# Patient Record
Sex: Female | Born: 1960 | Race: White | Hispanic: No | Marital: Married | Smoking: Former smoker
Health system: Southern US, Community
[De-identification: ages and names within clinical notes are randomized; demographics above are authoritative.]

## PROBLEM LIST (undated history)

## (undated) DIAGNOSIS — K219 Gastro-esophageal reflux disease without esophagitis: Secondary | ICD-10-CM

## (undated) HISTORY — PX: CHOLECYSTECTOMY: SHX55

---

## 1998-10-22 ENCOUNTER — Other Ambulatory Visit: Admission: RE | Admit: 1998-10-22 | Discharge: 1998-10-22 | Payer: Self-pay | Admitting: Obstetrics & Gynecology

## 2014-10-13 ENCOUNTER — Encounter (HOSPITAL_COMMUNITY): Payer: Self-pay | Admitting: Nurse Practitioner

## 2014-10-13 ENCOUNTER — Emergency Department (HOSPITAL_COMMUNITY): Payer: 59

## 2014-10-13 ENCOUNTER — Emergency Department (HOSPITAL_COMMUNITY)
Admission: EM | Admit: 2014-10-13 | Discharge: 2014-10-13 | Disposition: A | Discharge status: Home / Self Care | Payer: 59 | Attending: Emergency Medicine | Admitting: Emergency Medicine

## 2014-10-13 DIAGNOSIS — Z8719 Personal history of other diseases of the digestive system: Secondary | ICD-10-CM | POA: Diagnosis not present

## 2014-10-13 DIAGNOSIS — R1013 Epigastric pain: Secondary | ICD-10-CM | POA: Diagnosis not present

## 2014-10-13 DIAGNOSIS — R079 Chest pain, unspecified: Secondary | ICD-10-CM | POA: Diagnosis present

## 2014-10-13 DIAGNOSIS — R0789 Other chest pain: Secondary | ICD-10-CM

## 2014-10-13 DIAGNOSIS — Z720 Tobacco use: Secondary | ICD-10-CM | POA: Diagnosis not present

## 2014-10-13 HISTORY — DX: Gastro-esophageal reflux disease without esophagitis: K21.9

## 2014-10-13 LAB — BASIC METABOLIC PANEL
Anion gap: 11 (ref 5–15)
BUN: 9 mg/dL (ref 6–23)
CO2: 22 mmol/L (ref 19–32)
Calcium: 9.5 mg/dL (ref 8.4–10.5)
Chloride: 106 mmol/L (ref 96–112)
Creatinine, Ser: 0.83 mg/dL (ref 0.50–1.10)
GFR calc Af Amer: >90 mL/min (ref >90)
GFR calc non Af Amer: 80 mL/min — ABNORMAL LOW (ref >90)
GLUCOSE: 98 mg/dL (ref 70–99)
POTASSIUM: 3.8 mmol/L (ref 3.5–5.1)
Sodium: 139 mmol/L (ref 135–145)

## 2014-10-13 LAB — CBC
HEMATOCRIT: 40.7 % (ref 36.0–46.0)
HEMOGLOBIN: 14 g/dL (ref 12.0–15.0)
MCH: 32.6 pg (ref 26.0–34.0)
MCHC: 34.4 g/dL (ref 30.0–36.0)
MCV: 94.9 fL (ref 78.0–100.0)
Platelets: 191 10*3/uL (ref 150–400)
RBC: 4.29 MIL/uL (ref 3.87–5.11)
RDW: 12.3 % (ref 11.5–15.5)
WBC: 9.1 10*3/uL (ref 4.0–10.5)

## 2014-10-13 LAB — I-STAT TROPONIN, ED: TROPONIN I, POC: 0 ng/mL (ref 0.00–0.08)

## 2014-10-13 NOTE — Discharge Instructions (Signed)

## 2014-10-13 NOTE — ED Notes (Signed)
She c/o midsternal "deep chest pain radiating into my back" onset after waking this morning. Moving and breathing increases the pain, nothing relieves the pain. A&Ox4, resp e/u

## 2014-10-13 NOTE — ED Provider Notes (Signed)
CSN: [id number]     Arrival date & time 10/13/14  1810 History  This chart was scribed for Zadie Rhineonald Aubreana Cornacchia, MD by Annye AsaAnna Dorsett, ED Scribe. This patient was seen in room B18C/B18C and the patient's care was started at 11:08 PM.    Chief Complaint  Patient presents with  . Chest Pain   Patient is a 54 y.o. female presenting with chest pain. The history is provided by the patient. No language interpreter was used.  Chest Pain Pain location:  Substernal area Pain quality: sharp   Pain radiates to:  Mid back (throat) Pain radiates to the back: yes   Pain severity:  Moderate Onset quality:  Sudden Duration:  1 day Timing:  Constant Progression:  Improving Chronicity:  New Relieved by:  Nothing Worsened by:  Deep breathing and movement Ineffective treatments:  None tried Associated symptoms: back pain   Associated symptoms: no abdominal pain, no diaphoresis, no fever, no lower extremity edema, no near-syncope, no numbness, no shortness of breath, no syncope and not vomiting   Risk factors: smoking   Risk factors: no diabetes mellitus, no high cholesterol, no hypertension and no prior DVT/PE    HPI Comments: Penny Lopez is an otherwise healthy 54 y.o. female who presents to the Emergency Department complaining of "sharp" mid sternal chest pain, radiating to her throat and back, beginning this morning. Her pain has improved since onset. Patient reports her pain is exacerbated with movement and deep breathing. Nothing improves her pain; it is unaffected by applied pressure to the area or exertion. No treatments or medications tried PTA. She denies fevers, vomiting, diaphoresis, syncope, SOB, abdominal pain, extremity swelling, numbness or paresthesias in the lower extremities, any strenuous activity or recent long-term periods of immobility (e.g. no plane or car trips over 6-8 hours). She denies a personal history of DM, blood clots in her legs or lungs, stroke. She denies familial history of  MI. She denies prior experience with similar symptoms.   Family reports occasional epigastric abdominal pain recently; patient explains that this only occurs "once every few months," and did not concern her until now. She states that her last episode of abdominal pain was 4 days PTA. She is unsure if this is associated with her diet.   Patient regularly takes Nexium for acid reflux; current symptoms do not feel similar to prior experience with acid reflux. She is an everyday smoker. No PCP at this time.   Past Medical History  Diagnosis Date  . Acid reflux    History reviewed. No pertinent past surgical history. History reviewed. No pertinent family history. History  Substance Use Topics  . Smoking status: Current Every Day Smoker    Types: Cigarettes  . Smokeless tobacco: Not on file  . Alcohol Use: No   OB History    No data available     Review of Systems  Constitutional: Negative for fever and diaphoresis.  Respiratory: Negative for shortness of breath.   Cardiovascular: Positive for chest pain. Negative for leg swelling, syncope and near-syncope.  Gastrointestinal: Negative for vomiting and abdominal pain.  Musculoskeletal: Positive for back pain.  Neurological: Negative for numbness.  All other systems reviewed and are negative.  Allergies  Review of patient's allergies indicates no known allergies.  Home Medications   Prior to Admission medications   Not on File   BP 106/66 mmHg  Pulse 69  Temp(Src) 97.9 F (36.6 C) (Oral)  Resp 19  Ht 5\' 11"  (1.803 m)  Wt  204 lb 8 oz (92.761 kg)  BMI 28.53 kg/m2  SpO2 100% Physical Exam  Nursing note and vitals reviewed.   CONSTITUTIONAL: Well developed/well nourished HEAD: Normocephalic/atraumatic EYES: EOMI/PERRL ENMT: Mucous membranes moist NECK: supple no meningeal signs SPINE/BACK:entire spine nontender CV: S1/S2 noted, no murmurs/rubs/gallops noted; chest pain worsened with movement in the bed LUNGS: Lungs are  clear to auscultation bilaterally, no apparent distress ABDOMEN: soft, nontender, no rebound or guarding, bowel sounds noted throughout abdomen GU:no cva tenderness NEURO: Pt is awake/alert/appropriate, moves all extremitiesx4.  No facial droop.   EXTREMITIES: pulses normal/equal, full ROM, no LE edema or tenderness SKIN: warm, color normal PSYCH: no abnormalities of mood noted, alert and oriented to situation  ED Course  Procedures   DIAGNOSTIC STUDIES: Oxygen Saturation is 100% on RA, normal by my interpretation.    COORDINATION OF CARE: 11:20 PM Discussed treatment plan with pt at bedside and pt agreed to plan.   HEART score is less than 3 Pt prefers to go home and not wait for repeat troponin Pt understand that ruling out MI is difficult with one troponin Pt is well appearing, no distress, low suspicion for PE/Dissection She reports most of her pain is worse with leaning forward in bed and twisting her torso BP 115/72 mmHg  Pulse 63  Temp(Src) 97.9 F (36.6 C) (Oral)  Resp 15  Ht  (1.803 m)  Wt 204 lb 8 oz (92.761 kg)  BMI 28.53 kg/m2  SpO2 100%  Labs Review Labs Reviewed  BASIC METABOLIC PANEL - Abnormal; Notable for the following:    GFR calc non Af Amer 80 (*)    All other components within normal limits  CBC  I-STAT TROPOININ, ED    Imaging Review Dg Chest 2 View  10/13/2014   CLINICAL DATA:  Central chest pain beginning today  EXAM: CHEST  2 VIEW  COMPARISON:  None.  FINDINGS: Heart size is normal. Mediastinal shadows are normal. The lungs are clear. No bronchial thickening. No infiltrate, mass, effusion or collapse. Pulmonary vascularity is normal. No bony abnormality.  IMPRESSION: Normal chest   Electronically Signed   By: Paulina Fusi M.D.   On: 10/13/2014 19:03     EKG Interpretation   Date/Time:  Monday October 13 2014 18:20:56 EDT Ventricular Rate:  71 PR Interval:  162 QRS Duration: 76 QT Interval:  390 QTC Calculation: 423 R Axis:    34 Text Interpretation:  Normal sinus rhythm Normal ECG No old tracing to  compare Confirmed by [facility]  MD, DAVID ([id number]) on 10/13/2014 9:42:47 PM      MDM   Final diagnoses:  Other chest pain    Nursing notes including past medical history and social history reviewed and considered in documentation xrays/imaging reviewed by myself and considered during evaluation Labs/vital reviewed myself and considered during evaluation   I personally performed the services described in this documentation, which was scribed in my presence. The recorded information has been reviewed and is accurate.       Zadie Rhine, MD 10/13/14 [phone]

## 2014-11-08 ENCOUNTER — Emergency Department (HOSPITAL_BASED_OUTPATIENT_CLINIC_OR_DEPARTMENT_OTHER)
Admission: EM | Admit: 2014-11-08 | Discharge: 2014-11-08 | Disposition: A | Discharge status: Home / Self Care | Payer: 59 | Attending: Emergency Medicine | Admitting: Emergency Medicine

## 2014-11-08 ENCOUNTER — Encounter (HOSPITAL_BASED_OUTPATIENT_CLINIC_OR_DEPARTMENT_OTHER): Payer: Self-pay

## 2014-11-08 DIAGNOSIS — Z3202 Encounter for pregnancy test, result negative: Secondary | ICD-10-CM | POA: Diagnosis not present

## 2014-11-08 DIAGNOSIS — R109 Unspecified abdominal pain: Secondary | ICD-10-CM

## 2014-11-08 DIAGNOSIS — Z720 Tobacco use: Secondary | ICD-10-CM | POA: Diagnosis not present

## 2014-11-08 DIAGNOSIS — N390 Urinary tract infection, site not specified: Secondary | ICD-10-CM | POA: Insufficient documentation

## 2014-11-08 DIAGNOSIS — Z8719 Personal history of other diseases of the digestive system: Secondary | ICD-10-CM | POA: Insufficient documentation

## 2014-11-08 LAB — CBC WITH DIFFERENTIAL/PLATELET
BASOS ABS: 0 10*3/uL (ref 0.0–0.1)
Basophils Relative: 0 % (ref 0–1)
EOS ABS: 0.1 10*3/uL (ref 0.0–0.7)
EOS PCT: 1 % (ref 0–5)
HEMATOCRIT: 39.4 % (ref 36.0–46.0)
HEMOGLOBIN: 13.3 g/dL (ref 12.0–15.0)
Lymphocytes Relative: 27 % (ref 12–46)
Lymphs Abs: 2.7 10*3/uL (ref 0.7–4.0)
MCH: 32.4 pg (ref 26.0–34.0)
MCHC: 33.8 g/dL (ref 30.0–36.0)
MCV: 96.1 fL (ref 78.0–100.0)
Monocytes Absolute: 0.6 10*3/uL (ref 0.1–1.0)
Monocytes Relative: 6 % (ref 3–12)
Neutro Abs: 6.5 10*3/uL (ref 1.7–7.7)
Neutrophils Relative %: 66 % (ref 43–77)
PLATELETS: 212 10*3/uL (ref 150–400)
RBC: 4.1 MIL/uL (ref 3.87–5.11)
RDW: 11.8 % (ref 11.5–15.5)
WBC: 9.8 10*3/uL (ref 4.0–10.5)

## 2014-11-08 LAB — URINE MICROSCOPIC-ADD ON

## 2014-11-08 LAB — COMPREHENSIVE METABOLIC PANEL
ALT: 16 U/L (ref 14–54)
ANION GAP: 10 (ref 5–15)
AST: 18 U/L (ref 15–41)
Albumin: 4.1 g/dL (ref 3.5–5.0)
Alkaline Phosphatase: 97 U/L (ref 38–126)
BUN: 15 mg/dL (ref 6–20)
CHLORIDE: 105 mmol/L (ref 101–111)
CO2: 21 mmol/L — ABNORMAL LOW (ref 22–32)
Calcium: 8.8 mg/dL — ABNORMAL LOW (ref 8.9–10.3)
Creatinine, Ser: 0.88 mg/dL (ref 0.44–1.00)
Glucose, Bld: 113 mg/dL — ABNORMAL HIGH (ref 65–99)
POTASSIUM: 4.4 mmol/L (ref 3.5–5.1)
Sodium: 136 mmol/L (ref 135–145)
TOTAL PROTEIN: 6.9 g/dL (ref 6.5–8.1)
Total Bilirubin: 0.8 mg/dL (ref 0.3–1.2)

## 2014-11-08 LAB — URINALYSIS, ROUTINE W REFLEX MICROSCOPIC
BILIRUBIN URINE: NEGATIVE
Glucose, UA: NEGATIVE mg/dL
Hgb urine dipstick: NEGATIVE
KETONES UR: NEGATIVE mg/dL
Nitrite: NEGATIVE
Protein, ur: NEGATIVE mg/dL
Specific Gravity, Urine: 1.02 (ref 1.005–1.030)
UROBILINOGEN UA: 1 mg/dL (ref 0.0–1.0)
pH: 7.5 (ref 5.0–8.0)

## 2014-11-08 LAB — PREGNANCY, URINE: Preg Test, Ur: NEGATIVE

## 2014-11-08 LAB — LIPASE, BLOOD: Lipase: 29 U/L (ref 22–51)

## 2014-11-08 LAB — TROPONIN I: Troponin I: <0.03 ng/mL (ref <0.031)

## 2014-11-08 MED ORDER — CEPHALEXIN 500 MG PO CAPS: 500.0000 mg | ORAL_CAPSULE | Freq: Four times a day (QID) | ORAL | Status: DC

## 2014-11-08 MED ORDER — GI COCKTAIL ~~LOC~~
30.0000 mL | Freq: Once | ORAL | Status: AC
  Administered 2014-11-08: 30 mL via ORAL
  Filled 2014-11-08: qty 30

## 2014-11-08 NOTE — ED Notes (Signed)
Patient here with umbilicus pain that started this am. Nausea with same but no vomiting, no diarrhea. Stopped taking acid medication x 1 week

## 2014-11-08 NOTE — Discharge Instructions (Signed)

## 2014-11-08 NOTE — ED Provider Notes (Signed)
CSN: [id number]     Arrival date & time 11/08/14  1801 History   First MD Initiated Contact with Patient 11/08/14 1844     Chief Complaint  Patient presents with  . Abdominal Pain     (Consider location/radiation/quality/duration/timing/severity/associated sxs/prior Treatment) HPI Penny Lopez is a 54 y.o. female because in for evaluation of abdominal discomfort. She reports a gradual onset umbilical pain similar to previous pains in the past approximately 13:00 following lunch at 12:30. Does not radiate. Rated as 7/10. Patient states she has had these stomach pains on and off for the past 8 years that have been well controlled with self prescribed Nexium. Patient states she has had "acid reflux for years and had been taking Nexium, but saw that it causes kidney problems so I quit taking it last week". Denies any chest pain, shortness of breath, vomiting, urinary symptoms, back pain, dizziness, syncope, dark or tarry stools, bloody stools.  Past Medical History  Diagnosis Date  . Acid reflux    History reviewed. No pertinent past surgical history. No family history on file. History  Substance Use Topics  . Smoking status: Current Every Day Smoker    Types: Cigarettes  . Smokeless tobacco: Not on file  . Alcohol Use: No   OB History    No data available     Review of Systems A 10 point review of systems was completed and was negative except for pertinent positives and negatives as mentioned in the history of present illness     Allergies  Review of patient's allergies indicates no known allergies.  Home Medications   Prior to Admission medications   Medication Sig Start Date End Date Taking? Authorizing Provider  cephALEXin (KEFLEX) 500 MG capsule Take 1 capsule (500 mg total) by mouth 4 (four) times daily. 11/08/14   Jovaun Levene, PA-C   BP 133/72 mmHg  Pulse 58  Temp(Src) 97.9 F (36.6 C) (Oral)  Resp 18  Ht 5\' 11"  (1.803 m)  Wt 200 lb (90.719 kg)  BMI 27.91  kg/m2  SpO2 98% Physical Exam  Constitutional: She is oriented to person, place, and time. She appears well-developed and well-nourished.  HENT:  Head: Normocephalic and atraumatic.  Mouth/Throat: Oropharynx is clear and moist.  Eyes: Conjunctivae are normal. Pupils are equal, round, and reactive to light. Right eye exhibits no discharge. Left eye exhibits no discharge. No scleral icterus.  Neck: Neck supple.  Cardiovascular: Normal rate, regular rhythm and normal heart sounds.   Pulmonary/Chest: Effort normal and breath sounds normal. No respiratory distress. She has no wheezes. She has no rales.  Abdominal: Soft. There is no tenderness.  Abdomen soft, nondistended without any focal tenderness. No rebound or guarding. No palpable or pulsatile masses. Negative Murphy's, McBurney's. Bowel sounds active, no renal bruits. No other lesions or deformities noted  Musculoskeletal: She exhibits no tenderness.  Neurological: She is alert and oriented to person, place, and time.  Cranial Nerves II-XII grossly intact  Skin: Skin is warm and dry. No rash noted.  Psychiatric: She has a normal mood and affect.  Nursing note and vitals reviewed.   ED Course  Procedures (including critical care time) Labs Review Labs Reviewed  URINALYSIS, ROUTINE W REFLEX MICROSCOPIC - Abnormal; Notable for the following:    APPearance CLOUDY (*)    Leukocytes, UA MODERATE (*)    All other components within normal limits  COMPREHENSIVE METABOLIC PANEL - Abnormal; Notable for the following:    CO2 21 (*)  Glucose, Bld 113 (*)    Calcium 8.8 (*)    All other components within normal limits  URINE MICROSCOPIC-ADD ON - Abnormal; Notable for the following:    Squamous Epithelial / LPF MANY (*)    Bacteria, UA MANY (*)    All other components within normal limits  CBC WITH DIFFERENTIAL/PLATELET  LIPASE, BLOOD  TROPONIN I  PREGNANCY, URINE    Imaging Review No results found.   EKG Interpretation None      Meds given in ED:  Medications  gi cocktail (Maalox,Lidocaine,Donnatal) (30 mLs Oral Given 11/08/14 1911)    New Prescriptions   CEPHALEXIN (KEFLEX) 500 MG CAPSULE    Take 1 capsule (500 mg total) by mouth 4 (four) times daily.   Filed Vitals:   11/08/14 1807 11/08/14 2058  BP: 121/80 133/72  Pulse: 74 58  Temp: 98.2 F (36.8 C) 97.9 F (36.6 C)  TempSrc: Oral Oral  Resp: 18 18  Height: 5\' 11"  (1.803 m)   Weight: 200 lb (90.719 kg)   SpO2: 100% 98%    MDM  Vitals stable - WNL -afebrile Pt resting comfortably in ED.  reports she feels much better after administration of GI cocktail. PE--normal abdominal exam. Labwork-labs essentially noncontributory, however there is evidence of UTI on urinalysis.  At this time I feel patient's symptoms are likely due to GI etiology, likely GERD. Encouraged her to restart her PPI, declines any prescription. Will treat UTI empirically. Doubt cardiac or pulmonic source for discomfort today.  I discussed all relevant lab findings and imaging results with pt and they verbalized understanding. Discussed f/u with PCP within 48 hrs and return precautions, pt very amenable to plan.  Final diagnoses:  UTI (lower urinary tract infection)  Abdominal discomfort       Joycie PeekBenjamin Cora Stetson, PA-C 11/08/14 2123  Richardean Canalavid H Yao, MD 11/08/14 [phone]

## 2016-10-28 DIAGNOSIS — R1013 Epigastric pain: Secondary | ICD-10-CM | POA: Diagnosis not present

## 2016-10-28 DIAGNOSIS — R932 Abnormal findings on diagnostic imaging of liver and biliary tract: Secondary | ICD-10-CM | POA: Diagnosis not present

## 2016-10-28 DIAGNOSIS — R1011 Right upper quadrant pain: Secondary | ICD-10-CM | POA: Diagnosis not present

## 2016-10-28 DIAGNOSIS — R110 Nausea: Secondary | ICD-10-CM | POA: Diagnosis not present

## 2016-10-29 DIAGNOSIS — R1011 Right upper quadrant pain: Secondary | ICD-10-CM | POA: Diagnosis not present

## 2016-10-30 DIAGNOSIS — J449 Chronic obstructive pulmonary disease, unspecified: Secondary | ICD-10-CM | POA: Diagnosis not present

## 2016-10-30 DIAGNOSIS — K8064 Calculus of gallbladder and bile duct with chronic cholecystitis without obstruction: Secondary | ICD-10-CM | POA: Diagnosis not present

## 2016-10-30 DIAGNOSIS — R1011 Right upper quadrant pain: Secondary | ICD-10-CM | POA: Diagnosis not present

## 2016-10-30 DIAGNOSIS — K828 Other specified diseases of gallbladder: Secondary | ICD-10-CM | POA: Diagnosis not present

## 2016-10-30 DIAGNOSIS — R109 Unspecified abdominal pain: Secondary | ICD-10-CM | POA: Diagnosis not present

## 2016-10-30 DIAGNOSIS — D72829 Elevated white blood cell count, unspecified: Secondary | ICD-10-CM | POA: Diagnosis not present

## 2016-10-30 DIAGNOSIS — R110 Nausea: Secondary | ICD-10-CM | POA: Diagnosis not present

## 2016-10-31 DIAGNOSIS — K8064 Calculus of gallbladder and bile duct with chronic cholecystitis without obstruction: Secondary | ICD-10-CM | POA: Diagnosis not present

## 2016-10-31 DIAGNOSIS — J449 Chronic obstructive pulmonary disease, unspecified: Secondary | ICD-10-CM | POA: Diagnosis not present

## 2016-10-31 DIAGNOSIS — K8010 Calculus of gallbladder with chronic cholecystitis without obstruction: Secondary | ICD-10-CM | POA: Diagnosis not present

## 2016-10-31 DIAGNOSIS — D72829 Elevated white blood cell count, unspecified: Secondary | ICD-10-CM | POA: Diagnosis not present

## 2016-10-31 DIAGNOSIS — R109 Unspecified abdominal pain: Secondary | ICD-10-CM | POA: Diagnosis not present

## 2017-05-28 DIAGNOSIS — N3091 Cystitis, unspecified with hematuria: Secondary | ICD-10-CM | POA: Diagnosis not present

## 2017-05-28 DIAGNOSIS — N3001 Acute cystitis with hematuria: Secondary | ICD-10-CM | POA: Diagnosis not present

## 2017-12-05 DIAGNOSIS — N3091 Cystitis, unspecified with hematuria: Secondary | ICD-10-CM | POA: Diagnosis not present

## 2017-12-05 DIAGNOSIS — N3000 Acute cystitis without hematuria: Secondary | ICD-10-CM | POA: Diagnosis not present

## 2018-05-30 DIAGNOSIS — M545 Low back pain: Secondary | ICD-10-CM | POA: Diagnosis not present

## 2018-05-30 DIAGNOSIS — K219 Gastro-esophageal reflux disease without esophagitis: Secondary | ICD-10-CM | POA: Insufficient documentation

## 2018-05-30 HISTORY — DX: Gastro-esophageal reflux disease without esophagitis: K21.9

## 2018-08-13 DIAGNOSIS — M940 Chondrocostal junction syndrome [Tietze]: Secondary | ICD-10-CM | POA: Diagnosis not present

## 2018-10-06 DIAGNOSIS — M545 Low back pain: Secondary | ICD-10-CM | POA: Diagnosis not present

## 2021-03-13 ENCOUNTER — Emergency Department (HOSPITAL_BASED_OUTPATIENT_CLINIC_OR_DEPARTMENT_OTHER)
Admission: EM | Admit: 2021-03-13 | Discharge: 2021-03-13 | Disposition: A | Discharge status: Home / Self Care | Payer: 59 | Attending: Emergency Medicine | Admitting: Emergency Medicine

## 2021-03-13 ENCOUNTER — Encounter (HOSPITAL_BASED_OUTPATIENT_CLINIC_OR_DEPARTMENT_OTHER): Payer: Self-pay | Admitting: Emergency Medicine

## 2021-03-13 ENCOUNTER — Emergency Department (HOSPITAL_BASED_OUTPATIENT_CLINIC_OR_DEPARTMENT_OTHER): Payer: 59

## 2021-03-13 ENCOUNTER — Other Ambulatory Visit: Payer: Self-pay

## 2021-03-13 DIAGNOSIS — R079 Chest pain, unspecified: Secondary | ICD-10-CM

## 2021-03-13 DIAGNOSIS — R072 Precordial pain: Secondary | ICD-10-CM | POA: Insufficient documentation

## 2021-03-13 DIAGNOSIS — F17210 Nicotine dependence, cigarettes, uncomplicated: Secondary | ICD-10-CM | POA: Insufficient documentation

## 2021-03-13 LAB — CBC
HCT: 40 % (ref 36.0–46.0)
Hemoglobin: 14.1 g/dL (ref 12.0–15.0)
MCH: 34.6 pg — ABNORMAL HIGH (ref 26.0–34.0)
MCHC: 35.3 g/dL (ref 30.0–36.0)
MCV: 98.3 fL (ref 80.0–100.0)
Platelets: 187 10*3/uL (ref 150–400)
RBC: 4.07 MIL/uL (ref 3.87–5.11)
RDW: 11.9 % (ref 11.5–15.5)
WBC: 9.6 10*3/uL (ref 4.0–10.5)
nRBC: 0 % (ref 0.0–0.2)

## 2021-03-13 LAB — TROPONIN I (HIGH SENSITIVITY)
Troponin I (High Sensitivity): 125 ng/L (ref <18)
Troponin I (High Sensitivity): 129 ng/L (ref <18)

## 2021-03-13 LAB — BASIC METABOLIC PANEL
Anion gap: 8 (ref 5–15)
BUN: 13 mg/dL (ref 6–20)
CO2: 21 mmol/L — ABNORMAL LOW (ref 22–32)
Calcium: 8.7 mg/dL — ABNORMAL LOW (ref 8.9–10.3)
Chloride: 106 mmol/L (ref 98–111)
Creatinine, Ser: 0.81 mg/dL (ref 0.44–1.00)
GFR, Estimated: >60 mL/min (ref >60)
Glucose, Bld: 157 mg/dL — ABNORMAL HIGH (ref 70–99)
Potassium: 3.9 mmol/L (ref 3.5–5.1)
Sodium: 135 mmol/L (ref 135–145)

## 2021-03-13 LAB — PREGNANCY, URINE: Preg Test, Ur: NEGATIVE

## 2021-03-13 MED ORDER — ALUM & MAG HYDROXIDE-SIMETH 200-200-20 MG/5ML PO SUSP
30.0000 mL | Freq: Once | ORAL | Status: AC
  Administered 2021-03-13: 30 mL via ORAL
  Filled 2021-03-13: qty 30

## 2021-03-13 MED ORDER — ASPIRIN 81 MG PO CHEW
324.0000 mg | CHEWABLE_TABLET | Freq: Once | ORAL | Status: AC
  Administered 2021-03-13: 324 mg via ORAL
  Filled 2021-03-13: qty 4

## 2021-03-13 MED ORDER — LIDOCAINE VISCOUS HCL 2 % MT SOLN
15.0000 mL | Freq: Once | OROMUCOSAL | Status: AC
  Administered 2021-03-13: 15 mL via ORAL
  Filled 2021-03-13: qty 15

## 2021-03-13 NOTE — ED Triage Notes (Signed)
Pt reports mid CP intermittently since Wed; denies other sxs and cannot articulate type of pain

## 2021-03-13 NOTE — Discharge Instructions (Addendum)
You are seen in the emergency department for 4 days of intermittent chest pain.  You had blood work EKG and a chest x-ray here that did not show an obvious explanation for your pain.  Your troponin heart enzyme was mildly elevated but not rising.  We are setting you up for an outpatient follow-up with cardiology.  If you experience any worsening or concerning symptoms please return to the emergency department.  It would be reasonable to take 81 mg of aspirin daily.

## 2021-03-13 NOTE — ED Provider Notes (Signed)
[facility] EMERGENCY DEPARTMENT Provider Note   CSN: [id number] Arrival date & time: 03/13/21  1839     History Chief Complaint  Patient presents with   Chest Pain    Penny Lopez is a 60 y.o. female.  She is here with a complaint of on and off chest pain that started 4 days ago.  Never completely goes away but does sometimes worsen.  Improved as she stays still.  Not associate with any shortness of breath, reflux, sweatiness dizziness.  No nausea and vomiting.  Has tried nothing for it.  She is a smoker but no cardiac history  The history is provided by the patient.  Chest Pain Pain location:  Substernal area Pain quality: aching and dull   Pain radiates to:  Does not radiate Pain severity:  Moderate Onset quality:  Gradual Duration:  4 days Timing:  Intermittent Progression:  Unchanged Chronicity:  New Context: movement   Relieved by:  Nothing Worsened by:  Certain positions and movement Ineffective treatments:  Certain positions Associated symptoms: no abdominal pain, no cough, no diaphoresis, no dizziness, no dysphagia, no fever, no heartburn, no nausea, no shortness of breath and no vomiting   Risk factors: smoking       Past Medical History:  Diagnosis Date   Acid reflux     There are no problems to display for this patient.   Past Surgical History:  Procedure Laterality Date   CHOLECYSTECTOMY       OB History   No obstetric history on file.     No family history on file.  Social History   Tobacco Use   Smoking status: Every Day    Types: Cigarettes   Smokeless tobacco: Never  Substance Use Topics   Alcohol use: No   Drug use: No    Home Medications Prior to Admission medications   Medication Sig Start Date End Date Taking? Authorizing Provider  cephALEXin (KEFLEX) 500 MG capsule Take 1 capsule (500 mg total) by mouth 4 (four) times daily. 11/08/14   Joycie Peek, PA-C    Allergies    Patient has no known  allergies.  Review of Systems   Review of Systems  Constitutional:  Negative for diaphoresis and fever.  HENT:  Negative for sore throat and trouble swallowing.   Eyes:  Negative for visual disturbance.  Respiratory:  Negative for cough and shortness of breath.   Cardiovascular:  Positive for chest pain.  Gastrointestinal:  Negative for abdominal pain, heartburn, nausea and vomiting.  Genitourinary:  Negative for dysuria.  Musculoskeletal:  Negative for neck pain.  Skin:  Negative for rash.  Neurological:  Negative for dizziness.   Physical Exam Updated Vital Signs BP 137/84 (BP Location: Right Arm)   Pulse 77   Temp 98.2 F (36.8 C) (Oral)   Resp 18   Ht 5\' 11"  (1.803 m)   Wt 92.1 kg   SpO2 97%   BMI 28.31 kg/m   Physical Exam Vitals and nursing note reviewed.  Constitutional:      General: She is not in acute distress.    Appearance: She is well-developed.  HENT:     Head: Normocephalic and atraumatic.  Eyes:     Conjunctiva/sclera: Conjunctivae normal.  Cardiovascular:     Rate and Rhythm: Normal rate and regular rhythm.     Heart sounds: Normal heart sounds. No murmur heard. Pulmonary:     Effort: Pulmonary effort is normal. No respiratory distress.  Breath sounds: Normal breath sounds.  Abdominal:     Palpations: Abdomen is soft.     Tenderness: There is no abdominal tenderness.  Musculoskeletal:        General: Normal range of motion.     Cervical back: Neck supple.     Right lower leg: No tenderness. No edema.     Left lower leg: No tenderness. No edema.  Skin:    General: Skin is warm and dry.  Neurological:     General: No focal deficit present.     Mental Status: She is alert.    ED Results / Procedures / Treatments   Labs (all labs ordered are listed, but only abnormal results are displayed) Labs Reviewed  BASIC METABOLIC PANEL - Abnormal; Notable for the following components:      Result Value   CO2 21 (*)    Glucose, Bld 157 (*)     Calcium 8.7 (*)    All other components within normal limits  CBC - Abnormal; Notable for the following components:   MCH 34.6 (*)    All other components within normal limits  TROPONIN I (HIGH SENSITIVITY) - Abnormal; Notable for the following components:   Troponin I (High Sensitivity) 125 (*)    All other components within normal limits  TROPONIN I (HIGH SENSITIVITY) - Abnormal; Notable for the following components:   Troponin I (High Sensitivity) 129 (*)    All other components within normal limits  PREGNANCY, URINE    EKG EKG Interpretation  Date/Time:  Saturday March 13 2021 18:50:42 EDT Ventricular Rate:  82 PR Interval:  154 QRS Duration: 76 QT Interval:  354 QTC Calculation: 413 R Axis:   6 Text Interpretation: Normal sinus rhythm Normal ECG No significant change since prior 4/16 Confirmed by Meridee Score [phone]) on 03/13/2021 6:57:43 PM  Radiology DG Chest 2 View  Result Date: 03/13/2021 CLINICAL DATA:  Mid chest pain since Wednesday EXAM: CHEST - 2 VIEW COMPARISON:  10/13/2014 FINDINGS: The heart size and mediastinal contours are within normal limits. Both lungs are clear. The visualized skeletal structures are unremarkable. IMPRESSION: No active cardiopulmonary disease. Electronically Signed   By: Burman Nieves M.D.   On: 03/13/2021 19:29    Procedures Procedures   Medications Ordered in ED Medications  aspirin chewable tablet 324 mg (324 mg Oral Given 03/13/21 2105)  alum & mag hydroxide-simeth (MAALOX/MYLANTA) 200-200-20 MG/5ML suspension 30 mL (30 mLs Oral Given 03/13/21 2235)    And  lidocaine (XYLOCAINE) 2 % viscous mouth solution 15 mL (15 mLs Oral Given 03/13/21 2235)    ED Course  I have reviewed the triage vital signs and the nursing notes.  Pertinent labs & imaging results that were available during my care of the patient were reviewed by me and considered in my medical decision making (see chart for details).  Clinical Course as of 03/14/21  6789  Sat Mar 13, 2021  1935 X-ray interpreted by me as no acute pulmonary disease.  Awaiting radiology reading [MB]  2100 Discussed with Dr. Baird Kay cardiology.  He is recommending a delta troponin and if significantly rising will need cath tomorrow.  No heparin currently.  If troponin is not significantly rising please contact him as she may still need a stress test. [MB]  2226 Discussed with [facility] cardiology Dr. Molli Hazard.  He felt with the troponin not significantly rising she is appropriate for outpatient follow-up.  Putting a referral into cardiology.  Reviewed with patient.  Will try  GI cocktail. [MB]    Clinical Course User Index [MB] Terrilee Files, MD   MDM Rules/Calculators/A&P                          This patient complains of chest pain for 4 days; this involves an extensive number of treatment Options and is a complaint that carries with it a high risk of complications and Morbidity. The differential includes ACS, pneumonia, PE, pneumothorax, vascular, reflux, musculoskeletal  I ordered, reviewed and interpreted labs, which included CBC with normal white count chemistries fairly normal other than low bicarb elevated glucose, troponins mildly elevated but flat, pregnancy test negative I ordered medication aspirin, GI cocktail I ordered imaging studies which included chest x-ray and I independently    visualized and interpreted imaging which showed no acute findings Additional history obtained from patient's husband Previous records obtained and reviewed in epic no recent cardiac work-ups I consulted Dr. Molli Hazard [facility] cardiology and discussed lab and imaging findings  Critical Interventions: None  After the interventions stated above, I reevaluated the patient and found patient currently be pain-free.  She says it comes and goes.  Reviewed work-up with her and cardiology recommendations.  Have placed a referral in for urgent cardiology follow-up.  Recommended that she  return to the emergency department if any return of symptoms.   Final Clinical Impression(s) / ED Diagnoses Final diagnoses:  Nonspecific chest pain    Rx / DC Orders ED Discharge Orders     None        Terrilee Files, MD 03/14/21 [phone]

## 2021-03-15 DIAGNOSIS — K219 Gastro-esophageal reflux disease without esophagitis: Secondary | ICD-10-CM | POA: Insufficient documentation

## 2021-03-16 ENCOUNTER — Other Ambulatory Visit: Payer: Self-pay

## 2021-03-16 ENCOUNTER — Ambulatory Visit: Payer: 59 | Admitting: Cardiology

## 2021-03-16 ENCOUNTER — Encounter: Payer: Self-pay | Admitting: Cardiology

## 2021-03-16 DIAGNOSIS — I209 Angina pectoris, unspecified: Secondary | ICD-10-CM

## 2021-03-16 DIAGNOSIS — F17210 Nicotine dependence, cigarettes, uncomplicated: Secondary | ICD-10-CM | POA: Insufficient documentation

## 2021-03-16 DIAGNOSIS — I200 Unstable angina: Secondary | ICD-10-CM

## 2021-03-16 HISTORY — DX: Unstable angina: I20.0

## 2021-03-16 HISTORY — DX: Nicotine dependence, cigarettes, uncomplicated: F17.210

## 2021-03-16 MED ORDER — ASPIRIN EC 81 MG PO TBEC: 81.0000 mg | DELAYED_RELEASE_TABLET | Freq: Every day | ORAL | 3 refills | Status: DC

## 2021-03-16 MED ORDER — NITROGLYCERIN 0.4 MG SL SUBL: 0.4000 mg | SUBLINGUAL_TABLET | SUBLINGUAL | 6 refills | Status: DC | PRN

## 2021-03-16 NOTE — Progress Notes (Signed)
Cardiology Office Note:    Date:  03/16/2021   ID:  Penny Lopez, DOB 01/05/1961, MRN [id number]  PCP:  Oneita Hurt, No  Cardiologist:  Garwin Brothers, MD   Referring MD: Terrilee Files, MD    ASSESSMENT:    1. Angina pectoris (HCC)   2. Cigarette smoker    PLAN:    In order of problems listed above:  Primary prevention stressed with the patient.  Importance of compliance with diet medication stressed and she vocalized understanding. Angina pectoris: Her symptoms are very concerning.  She was advised to take a coated aspirin on a daily basis and she agrees.  Sublingual nitroglycerin prescription was sent, its protocol and 911 protocol explained and the patient vocalized understanding questions were answered to the patient's satisfaction.In view of the patient's symptoms, I discussed with the patient options for evaluation. Invasive and noninvasive options were given to the patient. I discussed stress testing and coronary angiography and left heart catheterization at length. Benefits, pros and cons of each approach were discussed at length. Patient had multiple questions which were answered to the patient's satisfaction. Patient opted for invasive evaluation and we will set up for coronary angiography and left heart catheterization. Further recommendations will be made based on the findings with coronary angiography. In the interim if the patient has any significant symptoms in hospital to the nearest emergency room.  I also discussed CT coronary angiography with [facility] but she is not keen. Cigarette smoker: I spent 5 minutes with the patient discussing solely about smoking. Smoking cessation was counseled. I suggested to the patient also different medications and pharmacological interventions. Patient is keen to try stopping on its own at this time. He will get back to me if he needs any further assistance in this matter. She will be seen in follow-up appointment after the coronary angiography.   She had multiple questions which were answered to her satisfaction.   Medication Adjustments/Labs and Tests Ordered: Current medicines are reviewed at length with the patient today.  Concerns regarding medicines are outlined above.  No orders of the defined types were placed in this encounter.  No orders of the defined types were placed in this encounter.    History of Present Illness:    Penny Lopez is a 60 y.o. female who is being seen today for the evaluation of prevention of x-rays patient is a pleasant 60 year old female.  She has past medical history of heavy smoking since young age.  She denies any history of hypertension dyslipidemia or diabetes mellitus.  She mentions to me that she is experiencing substernal chest tightness radiating to the left arm.  This is of significant concern to her before she went to the emergency room.  I reviewed emergency room records.  Troponin was elevated.  Subsequently she was sent home and she went back to work.  She tells me that she has had 2 episodes which were very significant and she almost had to stop what she was doing.  At the time of my evaluation, the patient is alert awake oriented and in no distress.  She does not have any nitroglycerin tablets with her.  At this time she is comfortable.  Past Medical History:  Diagnosis Date   Acid reflux    Gastroesophageal reflux disease 05/30/2018    Past Surgical History:  Procedure Laterality Date   CHOLECYSTECTOMY      Current Medications: Current Meds  Medication Sig   Esomeprazole Magnesium (NEXIUM PO) Take 1  capsule by mouth daily.     Allergies:   Patient has no known allergies.   Social History   Socioeconomic History   Marital status: Married    Spouse name: Not on file   Number of children: Not on file   Years of education: Not on file   Highest education level: Not on file  Occupational History   Not on file  Tobacco Use   Smoking status: Every Day    Types:  Cigarettes   Smokeless tobacco: Never  Substance and Sexual Activity   Alcohol use: No   Drug use: No   Sexual activity: Not on file  Other Topics Concern   Not on file  Social History Narrative   Not on file   Social Determinants of Health   Financial Resource Strain: Not on file  Food Insecurity: Not on file  Transportation Needs: Not on file  Physical Activity: Not on file  Stress: Not on file  Social Connections: Not on file     Family History: The patient's family history includes Atrial fibrillation in her mother; Heart failure in her father. There is no history of Hypertension, Diabetes, Cancer, or Heart disease.  ROS:   Please see the history of present illness.    All other systems reviewed and are negative.  EKGs/Labs/Other Studies Reviewed:    The following studies were reviewed today: EKG reveals sinus rhythm and nonspecific ST-T changes.  Records from emergency room were reviewed.   Recent Labs: 03/13/2021: BUN 13; Creatinine, Ser 0.81; Hemoglobin 14.1; Platelets 187; Potassium 3.9; Sodium 135  Recent Lipid Panel No results found for: CHOL, TRIG, HDL, CHOLHDL, VLDL, LDLCALC, LDLDIRECT  Physical Exam:    VS:  BP 118/76   Pulse 78   Ht 5\' 11"  (1.803 m)   Wt 205 lb 6.4 oz (93.2 kg)   SpO2 97%   BMI 28.65 kg/m     Wt Readings from Last 3 Encounters:  03/16/21 205 lb 6.4 oz (93.2 kg)  03/13/21 203 lb (92.1 kg)  11/08/14 200 lb (90.7 kg)     GEN: Patient is in no acute distress HEENT: Normal NECK: No JVD; No carotid bruits LYMPHATICS: No lymphadenopathy CARDIAC: S1 S2 regular, 2/6 systolic murmur at the apex. RESPIRATORY:  Clear to auscultation without rales, wheezing or rhonchi  ABDOMEN: Soft, non-tender, non-distended MUSCULOSKELETAL:  No edema; No deformity  SKIN: Warm and dry NEUROLOGIC:  Alert and oriented x 3 PSYCHIATRIC:  Normal affect    Signed, 11/10/14, MD  03/16/2021 4:09 PM    [facility]

## 2021-03-16 NOTE — H&P (View-Only) (Signed)
Cardiology Office Note:    Date:  03/16/2021   ID:  Penny Lopez, DOB Apr 10, 1961, MRN [id number]  PCP:  Oneita Hurt, No  Cardiologist:  Garwin Brothers, MD   Referring MD: Terrilee Files, MD    ASSESSMENT:    1. Angina pectoris (HCC)   2. Cigarette smoker    PLAN:    In order of problems listed above:  Primary prevention stressed with the patient.  Importance of compliance with diet medication stressed and she vocalized understanding. Angina pectoris: Her symptoms are very concerning.  She was advised to take a coated aspirin on a daily basis and she agrees.  Sublingual nitroglycerin prescription was sent, its protocol and 911 protocol explained and the patient vocalized understanding questions were answered to the patient's satisfaction.In view of the patient's symptoms, I discussed with the patient options for evaluation. Invasive and noninvasive options were given to the patient. I discussed stress testing and coronary angiography and left heart catheterization at length. Benefits, pros and cons of each approach were discussed at length. Patient had multiple questions which were answered to the patient's satisfaction. Patient opted for invasive evaluation and we will set up for coronary angiography and left heart catheterization. Further recommendations will be made based on the findings with coronary angiography. In the interim if the patient has any significant symptoms in hospital to the nearest emergency room.  I also discussed CT coronary angiography with FFR but she is not keen. Cigarette smoker: I spent 5 minutes with the patient discussing solely about smoking. Smoking cessation was counseled. I suggested to the patient also different medications and pharmacological interventions. Patient is keen to try stopping on its own at this time. He will get back to me if he needs any further assistance in this matter. She will be seen in follow-up appointment after the coronary angiography.   She had multiple questions which were answered to her satisfaction.   Medication Adjustments/Labs and Tests Ordered: Current medicines are reviewed at length with the patient today.  Concerns regarding medicines are outlined above.  No orders of the defined types were placed in this encounter.  No orders of the defined types were placed in this encounter.    History of Present Illness:    Penny Lopez is a 60 y.o. female who is being seen today for the evaluation of prevention of x-rays patient is a pleasant 60 year old female.  She has past medical history of heavy smoking since young age.  She denies any history of hypertension dyslipidemia or diabetes mellitus.  She mentions to me that she is experiencing substernal chest tightness radiating to the left arm.  This is of significant concern to her before she went to the emergency room.  I reviewed emergency room records.  Troponin was elevated.  Subsequently she was sent home and she went back to work.  She tells me that she has had 2 episodes which were very significant and she almost had to stop what she was doing.  At the time of my evaluation, the patient is alert awake oriented and in no distress.  She does not have any nitroglycerin tablets with her.  At this time she is comfortable.  Past Medical History:  Diagnosis Date   Acid reflux    Gastroesophageal reflux disease 05/30/2018    Past Surgical History:  Procedure Laterality Date   CHOLECYSTECTOMY      Current Medications: Current Meds  Medication Sig   Esomeprazole Magnesium (NEXIUM PO) Take 1  capsule by mouth daily.     Allergies:   Patient has no known allergies.   Social History   Socioeconomic History   Marital status: Married    Spouse name: Not on file   Number of children: Not on file   Years of education: Not on file   Highest education level: Not on file  Occupational History   Not on file  Tobacco Use   Smoking status: Every Day    Types:  Cigarettes   Smokeless tobacco: Never  Substance and Sexual Activity   Alcohol use: No   Drug use: No   Sexual activity: Not on file  Other Topics Concern   Not on file  Social History Narrative   Not on file   Social Determinants of Health   Financial Resource Strain: Not on file  Food Insecurity: Not on file  Transportation Needs: Not on file  Physical Activity: Not on file  Stress: Not on file  Social Connections: Not on file     Family History: The patient's family history includes Atrial fibrillation in her mother; Heart failure in her father. There is no history of Hypertension, Diabetes, Cancer, or Heart disease.  ROS:   Please see the history of present illness.    All other systems reviewed and are negative.  EKGs/Labs/Other Studies Reviewed:    The following studies were reviewed today: EKG reveals sinus rhythm and nonspecific ST-T changes.  Records from emergency room were reviewed.   Recent Labs: 03/13/2021: BUN 13; Creatinine, Ser 0.81; Hemoglobin 14.1; Platelets 187; Potassium 3.9; Sodium 135  Recent Lipid Panel No results found for: CHOL, TRIG, HDL, CHOLHDL, VLDL, LDLCALC, LDLDIRECT  Physical Exam:    VS:  BP 118/76   Pulse 78   Ht 5\' 11"  (1.803 m)   Wt 205 lb 6.4 oz (93.2 kg)   SpO2 97%   BMI 28.65 kg/m     Wt Readings from Last 3 Encounters:  03/16/21 205 lb 6.4 oz (93.2 kg)  03/13/21 203 lb (92.1 kg)  11/08/14 200 lb (90.7 kg)     GEN: Patient is in no acute distress HEENT: Normal NECK: No JVD; No carotid bruits LYMPHATICS: No lymphadenopathy CARDIAC: S1 S2 regular, 2/6 systolic murmur at the apex. RESPIRATORY:  Clear to auscultation without rales, wheezing or rhonchi  ABDOMEN: Soft, non-tender, non-distended MUSCULOSKELETAL:  No edema; No deformity  SKIN: Warm and dry NEUROLOGIC:  Alert and oriented x 3 PSYCHIATRIC:  Normal affect    Signed, 11/10/14, MD  03/16/2021 4:09 PM    [facility]

## 2021-03-16 NOTE — Patient Instructions (Signed)
Medication Instructions:  Your physician has recommended you make the following change in your medication:   Take 81 mg coated aspirin daily. Use nitroglycerin as needed for chest pain.  *If you need a refill on your cardiac medications before your next appointment, please call your pharmacy*   Lab Work: Your physician recommends that you have a BMET and CBC today in the office for your upcoming procedure.  If you have labs (blood work) drawn today and your tests are completely normal, you will receive your results only by: MyChart Message (if you have MyChart) OR A paper copy in the mail If you have any lab test that is abnormal or we need to change your treatment, we will call you to review the results.   Testing/Procedures:  [facility]ARDIOVASCULAR [facility] [address] [place] [address] Dept: [phone] Loc: [phone]  Penny Lopez  03/16/2021  You are scheduled for a Cardiac Catheterization on Thursday, September 22 with Dr. Cristal De[place] End.  1. Please arrive at the [facility] (Main Entrance A) at [facility]: [address] [place], [place] [address] at 5:30 AM (This time is two hours before your procedure to ensure your preparation). Free valet parking service is available.   Special note: Every effort is made to have your procedure done on time. Please understand that emergencies sometimes delay scheduled procedures.  2. Diet: Do not eat solid foods after midnight.  The patient may have clear liquids until 5am upon the day of the procedure.  3. Labs: You had your labs done in the office.  4. Medication instructions in preparation for your procedure:   Contrast Allergy: No   On the morning of your procedure, take your Aspirin and any morning medicines NOT listed above.  You may use sips of water.  5. Plan for one night stay--bring personal belongings. 6. Bring a current list of your  medications and current insurance cards. 7. You MUST have a responsible person to drive you home. 8. Someone MUST be with you the first 24 hours after you arrive home or your discharge will be delayed. 9. Please wear clothes that are easy to get on and off and wear slip-on shoes.  Thank you for allowing [place] to care for you!   -- [facility]    Follow-Up: At [facility], you and your health needs are our priority.  As part of our continuing mission to provide you with exceptional heart care, we have created designated Provider Care Teams.  These Care Teams include your primary Cardiologist (physician) and Advanced Practice Providers (APPs -  Physician Assistants and Nurse Practitioners) who all work together to provide you with the care you need, when you need it.  We recommend signing up for the patient portal called "MyChart".  Sign up information is provided on this After Visit Summary.  MyChart is used to connect with patients for Virtual Visits (Telemedicine).  Patients are able to view lab/test results, encounter notes, upcoming appointments, etc.  Non-urgent messages can be sent to your provider as well.   To learn more about what you can do with MyChart, go to [email or web address].    Your next appointment:   1 month(s)  The format for your next appointment:   In Person  Provider:   Belva Crome, MD   Other Instructions  Coronary Angiogram With Stent Coronary angiogram with stent placement is a procedure to widen or open  a narrow blood vessel of the heart (coronary artery). Arteries may become blocked by cholesterol buildup (plaques) in the lining of the artery wall. When a coronary artery becomes partially blocked, blood flow to that area decreases. This may lead to chest pain or a heart attack (myocardial infarction). A stent is a small piece of metal that looks like mesh or spring. Stent placement may be done as treatment after a heart  attack, or to prevent a heart attack if a blocked artery is found by a coronary angiogram. Let your health care provider know about: Any allergies you have, including allergies to medicines or contrast dye. All medicines you are taking, including vitamins, herbs, eye drops, creams, and over-the-counter medicines. Any problems you or family members have had with anesthetic medicines. Any blood disorders you have. Any surgeries you have had. Any medical conditions you have, including kidney problems or kidney failure. Whether you are pregnant or may be pregnant. Whether you are breastfeeding. What are the risks? Generally, this is a safe procedure. However, serious problems may occur, including: Damage to nearby structures or organs, such as the heart, blood vessels, or kidneys. A return of blockage. Bleeding, infection, or bruising at the insertion site. A collection of blood under the skin (hematoma) at the insertion site. A blood clot in another part of the body. Allergic reaction to medicines or dyes. Bleeding into the abdomen (retroperitoneal bleeding). Stroke (rare). Heart attack (rare). What happens before the procedure? Staying hydrated Follow instructions from your health care provider about hydration, which may include: Up to 2 hours before the procedure - you may continue to drink clear liquids, such as water, clear fruit juice, black coffee, and plain tea.    Eating and drinking restrictions Follow instructions from your health care provider about eating and drinking, which may include: 8 hours before the procedure - stop eating heavy meals or foods, such as meat, fried foods, or fatty foods. 6 hours before the procedure - stop eating light meals or foods, such as toast or cereal. 2 hours before the procedure - stop drinking clear liquids. Medicines Ask your health care provider about: Changing or stopping your regular medicines. This is especially important if you are  taking diabetes medicines or blood thinners. Taking medicines such as aspirin and ibuprofen. These medicines can thin your blood. Do not take these medicines unless your health care provider tells you to take them. Generally, aspirin is recommended before a thin tube, called a catheter, is passed through a blood vessel and inserted into the heart (cardiac catheterization). Taking over-the-counter medicines, vitamins, herbs, and supplements. General instructions Do not use any products that contain nicotine or tobacco for at least 4 weeks before the procedure. These products include cigarettes, e-cigarettes, and chewing tobacco. If you need help quitting, ask your health care provider. Plan to have someone take you home from the hospital or clinic. If you will be going home right after the procedure, plan to have someone with you for 24 hours. You may have tests and imaging procedures. Ask your health care provider: How your insertion site will be marked. Ask which artery will be used for the procedure. What steps will be taken to help prevent infection. These may include: Removing hair at the insertion site. Washing skin with a germ-killing soap. Taking antibiotic medicine. What happens during the procedure? An IV will be inserted into one of your veins. Electrodes may be placed on your chest to monitor your heart rate during the procedure.  You will be given one or more of the following: A medicine to help you relax (sedative). A medicine to numb the area (local anesthetic) for catheter insertion. A small incision will be made for catheter insertion. The catheter will be inserted into an artery using a guide wire. The location may be in your groin, your wrist, or the fold of your arm (near your elbow). An X-ray procedure (fluoroscopy) will be used to help guide the catheter to the opening of the heart arteries. A dye will be injected into the catheter. X-rays will be taken. The dye helps to  show where any narrowing or blockages are located in the arteries. Tell your health care provider if you have chest pain or trouble breathing. A tiny wire will be guided to the blocked spot, and a balloon will be inflated to make the artery wider. The stent will be expanded to crush the plaques into the wall of the vessel. The stent will hold the area open and improve the blood flow. Most stents have a drug coating to reduce the risk of the stent narrowing over time. The artery may be made wider using a drill, laser, or other tools that remove plaques. The catheter will be removed when the blood flow improves. The stent will stay where it was placed, and the lining of the artery will grow over it. A bandage (dressing) will be placed on the insertion site. Pressure will be applied to stop bleeding. The IV will be removed. This procedure may vary among health care providers and hospitals.    What happens after the procedure? Your blood pressure, heart rate, breathing rate, and blood oxygen level will be monitored until you leave the hospital or clinic. If the procedure is done through the leg, you will lie flat in bed for a few hours or for as long as told by your health care provider. You will be instructed not to bend or cross your legs. The insertion site and the pulse in your foot or wrist will be checked often. You may have more blood tests, X-rays, and a test that records the electrical activity of your heart (electrocardiogram, or ECG). Do not drive for 24 hours if you were given a sedative during your procedure. Summary Coronary angiogram with stent placement is a procedure to widen or open a narrowed coronary artery. This is done to treat heart problems. Before the procedure, let your health care provider know about all the medical conditions and surgeries you have or have had. This is a safe procedure. However, some problems may occur, including damage to nearby structures or organs,  bleeding, blood clots, or allergies. Follow your health care provider's instructions about eating, drinking, medicines, and other lifestyle changes, such as quitting tobacco use before the procedure. This information is not intended to replace advice given to you by your health care provider. Make sure you discuss any questions you have with your health care provider. Document Revised: 01/02/2019 Document Reviewed: 01/02/2019 Elsevier Patient Education  2021 Elsevier Inc.  Aspirin and Your Heart Aspirin is a medicine that prevents the platelets in your blood from sticking together. Platelets are the cells that your blood uses for clotting. Aspirin can be used to help reduce the risk of blood clots, heart attacks, and other heart-related problems. What are the risks? Daily use of aspirin can cause side effects. Some of these include: Bleeding. Bleeding can be minor or serious. An example of minor bleeding is bleeding from a cut, and  the bleeding does not stop. An example of more serious bleeding is stomach bleeding or, rarely, bleeding into the brain. Your risk of bleeding increases if you are also taking NSAIDs, such as ibuprofen. Increased bruising. Upset stomach. An allergic reaction. People who have growths inside the nose (nasal polyps) have an increased risk of developing an aspirin allergy. How to use aspirin to care for your heart Take aspirin only as told by your health care provider. Make sure that you understand how much to take and what form to take. The two forms of aspirin are: Non-enteric-coated.This type of aspirin does not have a coating and is absorbed quickly. This type of aspirin also comes in a chewable form. Enteric-coated. This type of aspirin has a coating that releases the medicine very slowly. Enteric-coated aspirin might cause less stomach upset than non-enteric-coated aspirin. This type of aspirin should not be chewed or crushed. Work with your health care provider to  find out whether it is safe and beneficial for you to take aspirin daily. Taking aspirin daily may be helpful if: You have had a heart attack or chest pain, or you are at risk for a heart attack. You have a condition in which certain heart vessels are blocked (coronary artery disease), and you have had a procedure to treat it. Examples are: Open-heart surgery, such as coronary artery bypass surgery (CABG). Coronary angioplasty,which is done to widen a blood vessel of your heart. Having a small mesh tube, or stent, placed in your coronary artery. You have had certain types of stroke or a mini-stroke known as a transient ischemic attack (TIA). You have a narrowing of the arteries that supply the limbs (peripheral artery disease, or PAD). You have long-term (chronic) heart rhythm problems, such as atrial fibrillation, and your health care provider thinks aspirin may help. You have valve disease or have had surgery on a valve. You are considered at increased risk of developing coronary artery disease or PAD.    Follow these instructions at home Medicines Take over-the-counter and prescription medicines only as told by your health care provider. If you are taking blood thinners: Talk with your health care provider before you take any medicines that contain aspirin or NSAIDs, such as ibuprofen. These medicines increase your risk for dangerous bleeding. Take your medicine exactly as told, at the same time every day. Avoid activities that could cause injury or bruising, and follow instructions about how to prevent falls. Wear a medical alert bracelet or carry a card that lists what medicines you take. General instructions Do not drink alcohol if: Your health care provider tells you not to drink. You are pregnant, may be pregnant, or are planning to become pregnant. If you drink alcohol: Limit how much you use to: 0-1 drink a day for women. 0-2 drinks a day for men. Be aware of how much alcohol is  in your drink. In the [place] one drink equals one 12 oz bottle of beer (355 mL), one 5 oz glass of wine (148 mL), or one 1 oz glass of hard liquor (44 mL). Keep all follow-up visits as told by your health care provider. This is important. Where to find more information The American Heart Association: www.heart.org Contact a health care provider if you have: Unusual bleeding or bruising. Stomach pain or nausea. Ringing in your ears. An allergic reaction that causes hives, itchy skin, or swelling of the lips, tongue, or face. Get help right away if: You notice that your bowel movements are  bloody, or dark red or black in color. You vomit or cough up blood. You have blood in your urine. You cough, breathe loudly (wheeze), or feel short of breath. You have chest pain, especially if the pain spreads to your arms, back, neck, or jaw. You have a headache with confusion. You have any symptoms of a stroke. "BE FAST" is an easy way to remember the main warning signs of a stroke: B - Balance. Signs are dizziness, sudden trouble walking, or loss of balance. E - Eyes. Signs are trouble seeing or a sudden change in vision. F - Face. Signs are sudden weakness or numbness of the face, or the face or eyelid drooping on one side. A - Arms. Signs are weakness or numbness in an arm. This happens suddenly and usually on one side of the body. S - Speech. Signs are sudden trouble speaking, slurred speech, or trouble understanding what people say. T - Time. Time to call emergency services. Write down what time symptoms started. You have other signs of a stroke, such as: A sudden, severe headache with no known cause. Nausea or vomiting. Seizure. These symptoms may represent a serious problem that is an emergency. Do not wait to see if the symptoms will go away. Get medical help right away. Call your local emergency services (911 in the [place] Do not drive yourself to the hospital. Summary Aspirin use can help  reduce the risk of blood clots, heart attacks, and other heart-related problems. Daily use of aspirin can cause side effects. Take aspirin only as told by your health care provider. Make sure that you understand how much to take and what form to take. Your health care provider will help you determine whether it is safe and beneficial for you to take aspirin daily. This information is not intended to replace advice given to you by your health care provider. Make sure you discuss any questions you have with your health care provider. Document Revised: 03/18/2019 Document Reviewed: 03/18/2019 Elsevier Patient Education  2021 Elsevier Inc. Nitroglycerin sublingual tablets What is this medicine? NITROGLYCERIN (nye troe GLI ser in) is a type of vasodilator. It relaxes blood vessels, increasing the blood and oxygen supply to your heart. This medicine is used to relieve chest pain caused by angina. It is also used to prevent chest pain before activities like climbing stairs, going outdoors in cold weather, or sexual activity. This medicine may be used for other purposes; ask your health care provider or pharmacist if you have questions. COMMON BRAND NAME(S): Nitroquick, Nitrostat, Nitrotab What should I tell my health care provider before I take this medicine? They need to know if you have any of these conditions: anemia head injury, recent stroke, or bleeding in the brain liver disease previous heart attack an unusual or allergic reaction to nitroglycerin, other medicines, foods, dyes, or preservatives pregnant or trying to get pregnant breast-feeding How should I use this medicine? Take this medicine by mouth as needed. Use at the first sign of an angina attack (chest pain or tightness). You can also take this medicine 5 to 10 minutes before an event likely to produce chest pain. Follow the directions exactly as written on the prescription label. Place one tablet under your tongue and let it  dissolve. Do not swallow whole. Replace the dose if you accidentally swallow it. It will help if your mouth is not dry. Saliva around the tablet will help it to dissolve more quickly. Do not eat or drink, smoke or  chew tobacco while a tablet is dissolving. Sit down when taking this medicine. In an angina attack, you should feel better within 5 minutes after your first dose. You can take a dose every 5 minutes up to a total of 3 doses. If you do not feel better or feel worse after 1 dose, call 9-1-1 at once. Do not take more than 3 doses in 15 minutes. Your health care provider might give you other directions. Follow those directions if he or she does. Do not take your medicine more often than directed. Talk to your health care provider about the use of this medicine in children. Special care may be needed. Overdosage: If you think you have taken too much of this medicine contact a poison control center or emergency room at once. NOTE: This medicine is only for you. Do not share this medicine with others. What if I miss a dose? This does not apply. This medicine is only used as needed. What may interact with this medicine? Do not take this medicine with any of the following medications: certain migraine medicines like ergotamine and dihydroergotamine (DHE) medicines used to treat erectile dysfunction like sildenafil, tadalafil, and vardenafil riociguat This medicine may also interact with the following medications: alteplase aspirin heparin medicines for high blood pressure medicines for mental depression other medicines used to treat angina phenothiazines like chlorpromazine, mesoridazine, prochlorperazine, thioridazine This list may not describe all possible interactions. Give your health care provider a list of all the medicines, herbs, non-prescription drugs, or dietary supplements you use. Also tell them if you smoke, drink alcohol, or use illegal drugs. Some items may interact with your  medicine. What should I watch for while using this medicine? Tell your doctor or health care professional if you feel your medicine is no longer working. Keep this medicine with you at all times. Sit or lie down when you take your medicine to prevent falling if you feel dizzy or faint after using it. Try to remain calm. This will help you to feel better faster. If you feel dizzy, take several deep breaths and lie down with your feet propped up, or bend forward with your head resting between your knees. You may get drowsy or dizzy. Do not drive, use machinery, or do anything that needs mental alertness until you know how this drug affects you. Do not stand or sit up quickly, especially if you are an older patient. This reduces the risk of dizzy or fainting spells. Alcohol can make you more drowsy and dizzy. Avoid alcoholic drinks. Do not treat yourself for coughs, colds, or pain while you are taking this medicine without asking your doctor or health care professional for advice. Some ingredients may increase your blood pressure. What side effects may I notice from receiving this medicine? Side effects that you should report to your doctor or health care professional as soon as possible: allergic reactions (skin rash, itching or hives; swelling of the face, lips, or tongue) low blood pressure (dizziness; feeling faint or lightheaded, falls; unusually weak or tired) low red blood cell counts (trouble breathing; feeling faint; lightheaded, falls; unusually weak or tired) Side effects that usually do not require medical attention (report to your doctor or health care professional if they continue or are bothersome): facial flushing (redness) headache nausea, vomiting This list may not describe all possible side effects. Call your doctor for medical advice about side effects. You may report side effects to FDA at 1-800-FDA-1088. Where should I keep my medicine? Keep  out of the reach of children. Store at  room temperature between 20 and 25 degrees C (68 and 77 degrees F). Store in Retail buyer. Protect from light and moisture. Keep tightly closed. Throw away any unused medicine after the expiration date. NOTE: This sheet is a summary. It may not cover all possible information. If you have questions about this medicine, talk to your doctor, pharmacist, or health care provider.  2021 Elsevier/Gold Standard (2018-03-14 16:46:32)

## 2021-03-17 ENCOUNTER — Telehealth: Payer: Self-pay | Admitting: *Deleted

## 2021-03-17 LAB — CBC WITH DIFFERENTIAL/PLATELET
Basophils Absolute: 0 10*3/uL (ref 0.0–0.2)
Basos: 0 %
EOS (ABSOLUTE): 0.1 10*3/uL (ref 0.0–0.4)
Eos: 1 %
Hematocrit: 40.6 % (ref 34.0–46.6)
Hemoglobin: 14 g/dL (ref 11.1–15.9)
Immature Grans (Abs): 0 10*3/uL (ref 0.0–0.1)
Immature Granulocytes: 1 %
Lymphocytes Absolute: 1.5 10*3/uL (ref 0.7–3.1)
Lymphs: 21 %
MCH: 34.2 pg — ABNORMAL HIGH (ref 26.6–33.0)
MCHC: 34.5 g/dL (ref 31.5–35.7)
MCV: 99 fL — ABNORMAL HIGH (ref 79–97)
Monocytes Absolute: 0.6 10*3/uL (ref 0.1–0.9)
Monocytes: 8 %
Neutrophils Absolute: 4.9 10*3/uL (ref 1.4–7.0)
Neutrophils: 69 %
Platelets: 189 10*3/uL (ref 150–450)
RBC: 4.09 x10E6/uL (ref 3.77–5.28)
RDW: 11.7 % (ref 11.7–15.4)
WBC: 7.1 10*3/uL (ref 3.4–10.8)

## 2021-03-17 LAB — BASIC METABOLIC PANEL
BUN/Creatinine Ratio: 14 (ref 9–23)
BUN: 10 mg/dL (ref 6–24)
CO2: 20 mmol/L (ref 20–29)
Calcium: 9.1 mg/dL (ref 8.7–10.2)
Chloride: 99 mmol/L (ref 96–106)
Creatinine, Ser: 0.72 mg/dL (ref 0.57–1.00)
Glucose: 81 mg/dL (ref 65–99)
Potassium: 4.1 mmol/L (ref 3.5–5.2)
Sodium: 136 mmol/L (ref 134–144)
eGFR: 97 mL/min/{1.73_m2} (ref >59)

## 2021-03-17 NOTE — Telephone Encounter (Signed)
Cardiac catheterization scheduled at [facility] for: Thursday March 18, 2021 7:30 AM [facility] Main Entrance A [facility]) at: 5:30 AM   No solid food after midnight prior to cath, clear liquids until 5 AM day of procedure.   Usual morning medications can be taken pre-cath with sips of water including aspirin 81 mg.    Confirmed patient has responsible adult to drive home post procedure and be with patient first 24 hours after arriving home.  You are allowed one visitor in the waiting room during the time you are at the hospital for your procedure. Both you and your visitor must wear a mask once you enter the hospital.   Patient reports does not currently have any symptoms concerning for COVID-19 and no household members with COVID-19 like illness.      Reviewed procedure/mask/visitor instructions with patient.

## 2021-03-18 ENCOUNTER — Other Ambulatory Visit (HOSPITAL_COMMUNITY): Payer: Self-pay

## 2021-03-18 ENCOUNTER — Other Ambulatory Visit: Payer: Self-pay | Admitting: Internal Medicine

## 2021-03-18 ENCOUNTER — Other Ambulatory Visit: Payer: Self-pay

## 2021-03-18 ENCOUNTER — Ambulatory Visit (HOSPITAL_COMMUNITY)
Admission: RE | Admit: 2021-03-18 | Discharge: 2021-03-18 | Disposition: A | Discharge status: Home / Self Care | Payer: 59 | Source: Ambulatory Visit | Attending: Internal Medicine | Admitting: Internal Medicine

## 2021-03-18 ENCOUNTER — Encounter (HOSPITAL_COMMUNITY)
Admission: RE | Disposition: A | Discharge status: Home / Self Care | Payer: Self-pay | Source: Ambulatory Visit | Attending: Internal Medicine

## 2021-03-18 DIAGNOSIS — Z955 Presence of coronary angioplasty implant and graft: Secondary | ICD-10-CM

## 2021-03-18 DIAGNOSIS — I25110 Atherosclerotic heart disease of native coronary artery with unstable angina pectoris: Secondary | ICD-10-CM | POA: Insufficient documentation

## 2021-03-18 DIAGNOSIS — K219 Gastro-esophageal reflux disease without esophagitis: Secondary | ICD-10-CM | POA: Diagnosis present

## 2021-03-18 DIAGNOSIS — F17210 Nicotine dependence, cigarettes, uncomplicated: Secondary | ICD-10-CM | POA: Diagnosis not present

## 2021-03-18 DIAGNOSIS — Z8249 Family history of ischemic heart disease and other diseases of the circulatory system: Secondary | ICD-10-CM | POA: Insufficient documentation

## 2021-03-18 DIAGNOSIS — Z79899 Other long term (current) drug therapy: Secondary | ICD-10-CM | POA: Diagnosis not present

## 2021-03-18 DIAGNOSIS — I2510 Atherosclerotic heart disease of native coronary artery without angina pectoris: Secondary | ICD-10-CM

## 2021-03-18 DIAGNOSIS — I200 Unstable angina: Secondary | ICD-10-CM | POA: Diagnosis present

## 2021-03-18 DIAGNOSIS — I209 Angina pectoris, unspecified: Secondary | ICD-10-CM

## 2021-03-18 HISTORY — DX: Atherosclerotic heart disease of native coronary artery without angina pectoris: I25.10

## 2021-03-18 HISTORY — PX: CORONARY STENT INTERVENTION: CATH118234

## 2021-03-18 HISTORY — PX: LEFT HEART CATH AND CORONARY ANGIOGRAPHY: CATH118249

## 2021-03-18 LAB — POCT ACTIVATED CLOTTING TIME
Activated Clotting Time: 289 seconds
Activated Clotting Time: 300 seconds

## 2021-03-18 LAB — ALT: ALT: 12 U/L (ref 0–44)

## 2021-03-18 LAB — LIPID PANEL
Cholesterol: 113 mg/dL (ref 0–200)
HDL: 34 mg/dL — ABNORMAL LOW (ref >40)
LDL Cholesterol: 74 mg/dL (ref 0–99)
Total CHOL/HDL Ratio: 3.3 RATIO
Triglycerides: 25 mg/dL (ref <150)
VLDL: 5 mg/dL (ref 0–40)

## 2021-03-18 SURGERY — LEFT HEART CATH AND CORONARY ANGIOGRAPHY
Anesthesia: LOCAL

## 2021-03-18 MED ORDER — SODIUM CHLORIDE 0.9% FLUSH: 3.0000 mL | Freq: Two times a day (BID) | INTRAVENOUS | Status: DC

## 2021-03-18 MED ORDER — ACETAMINOPHEN 325 MG PO TABS: 650.0000 mg | ORAL_TABLET | ORAL | Status: DC | PRN

## 2021-03-18 MED ORDER — FENTANYL CITRATE (PF) 100 MCG/2ML IJ SOLN
INTRAMUSCULAR | Status: DC | PRN
  Administered 2021-03-18 (×2): 25 ug via INTRAVENOUS

## 2021-03-18 MED ORDER — ATORVASTATIN CALCIUM 80 MG PO TABS: 80.0000 mg | ORAL_TABLET | Freq: Every day | ORAL | 11 refills | Status: DC

## 2021-03-18 MED ORDER — TICAGRELOR 90 MG PO TABS
ORAL_TABLET | ORAL | Status: AC
  Filled 2021-03-18: qty 1

## 2021-03-18 MED ORDER — TICAGRELOR 90 MG PO TABS: 90.0000 mg | ORAL_TABLET | Freq: Two times a day (BID) | ORAL | Status: DC

## 2021-03-18 MED ORDER — NITROGLYCERIN 1 MG/10 ML FOR IR/CATH LAB
INTRA_ARTERIAL | Status: AC
  Filled 2021-03-18: qty 10

## 2021-03-18 MED ORDER — SODIUM CHLORIDE 0.9% FLUSH: 3.0000 mL | INTRAVENOUS | Status: DC | PRN

## 2021-03-18 MED ORDER — METOPROLOL TARTRATE 25 MG PO TABS: 12.5000 mg | ORAL_TABLET | Freq: Two times a day (BID) | ORAL | 3 refills | Status: DC

## 2021-03-18 MED ORDER — ONDANSETRON HCL 4 MG/2ML IJ SOLN: 4.0000 mg | Freq: Four times a day (QID) | INTRAMUSCULAR | Status: DC | PRN

## 2021-03-18 MED ORDER — HEPARIN SODIUM (PORCINE) 1000 UNIT/ML IJ SOLN
INTRAMUSCULAR | Status: DC | PRN
  Administered 2021-03-18 (×2): 4500 [IU] via INTRAVENOUS
  Administered 2021-03-18: 2000 [IU] via INTRAVENOUS

## 2021-03-18 MED ORDER — VERAPAMIL HCL 2.5 MG/ML IV SOLN
INTRAVENOUS | Status: DC | PRN
  Administered 2021-03-18: 10 mL via INTRA_ARTERIAL

## 2021-03-18 MED ORDER — SODIUM CHLORIDE 0.9 % IV SOLN: 250.0000 mL | INTRAVENOUS | Status: DC | PRN

## 2021-03-18 MED ORDER — LABETALOL HCL 5 MG/ML IV SOLN: 10.0000 mg | INTRAVENOUS | Status: AC | PRN

## 2021-03-18 MED ORDER — FENTANYL CITRATE (PF) 100 MCG/2ML IJ SOLN
INTRAMUSCULAR | Status: AC
  Filled 2021-03-18: qty 2

## 2021-03-18 MED ORDER — SODIUM CHLORIDE 0.9 % IV SOLN: INTRAVENOUS | Status: AC

## 2021-03-18 MED ORDER — NITROGLYCERIN 1 MG/10 ML FOR IR/CATH LAB
INTRA_ARTERIAL | Status: DC | PRN
  Administered 2021-03-18 (×2): 200 ug via INTRACORONARY

## 2021-03-18 MED ORDER — HYDRALAZINE HCL 20 MG/ML IJ SOLN: 10.0000 mg | INTRAMUSCULAR | Status: AC | PRN

## 2021-03-18 MED ORDER — MIDAZOLAM HCL 2 MG/2ML IJ SOLN
INTRAMUSCULAR | Status: AC
  Filled 2021-03-18: qty 2

## 2021-03-18 MED ORDER — TICAGRELOR 90 MG PO TABS: 90.0000 mg | ORAL_TABLET | Freq: Two times a day (BID) | ORAL | 3 refills | Status: DC

## 2021-03-18 MED ORDER — HEPARIN (PORCINE) IN NACL 1000-0.9 UT/500ML-% IV SOLN
INTRAVENOUS | Status: AC
  Filled 2021-03-18: qty 500

## 2021-03-18 MED ORDER — VERAPAMIL HCL 2.5 MG/ML IV SOLN
INTRAVENOUS | Status: AC
  Filled 2021-03-18: qty 2

## 2021-03-18 MED ORDER — ASPIRIN 81 MG PO CHEW: 81.0000 mg | CHEWABLE_TABLET | ORAL | Status: DC

## 2021-03-18 MED ORDER — ASPIRIN 81 MG PO CHEW: 81.0000 mg | CHEWABLE_TABLET | Freq: Every day | ORAL | Status: DC

## 2021-03-18 MED ORDER — SODIUM CHLORIDE 0.9 % WEIGHT BASED INFUSION
3.0000 mL/kg/h | INTRAVENOUS | Status: AC
  Administered 2021-03-18: 3 mL/kg/h via INTRAVENOUS

## 2021-03-18 MED ORDER — HEPARIN SODIUM (PORCINE) 1000 UNIT/ML IJ SOLN
INTRAMUSCULAR | Status: AC
  Filled 2021-03-18: qty 1

## 2021-03-18 MED ORDER — IOHEXOL 350 MG/ML SOLN
INTRAVENOUS | Status: DC | PRN
  Administered 2021-03-18: 90 mL

## 2021-03-18 MED ORDER — LIDOCAINE HCL (PF) 1 % IJ SOLN
INTRAMUSCULAR | Status: DC | PRN
  Administered 2021-03-18: 2 mL

## 2021-03-18 MED ORDER — SODIUM CHLORIDE 0.9 % WEIGHT BASED INFUSION: 1.0000 mL/kg/h | INTRAVENOUS | Status: DC

## 2021-03-18 MED ORDER — HEPARIN (PORCINE) IN NACL 1000-0.9 UT/500ML-% IV SOLN
INTRAVENOUS | Status: DC | PRN
  Administered 2021-03-18 (×2): 500 mL

## 2021-03-18 MED ORDER — TICAGRELOR 90 MG PO TABS
ORAL_TABLET | ORAL | Status: DC | PRN
  Administered 2021-03-18: 180 mg via ORAL

## 2021-03-18 MED ORDER — LIDOCAINE HCL (PF) 1 % IJ SOLN
INTRAMUSCULAR | Status: AC
  Filled 2021-03-18: qty 30

## 2021-03-18 MED ORDER — TICAGRELOR 90 MG PO TABS
90.0000 mg | ORAL_TABLET | Freq: Two times a day (BID) | ORAL | 0 refills | Status: DC
  Filled 2021-03-18: qty 60, 30d supply, fill #0

## 2021-03-18 MED ORDER — MIDAZOLAM HCL 2 MG/2ML IJ SOLN
INTRAMUSCULAR | Status: DC | PRN
  Administered 2021-03-18 (×2): 1 mg via INTRAVENOUS

## 2021-03-18 SURGICAL SUPPLY — 16 items
BALLN EMERGE MR 2.5X8 (BALLOONS) ×2
BALLN SAPPHIRE ~~LOC~~ 3.25X10 (BALLOONS) ×1 IMPLANT
BALLOON EMERGE MR 2.5X8 (BALLOONS) IMPLANT
CATH 5FR JL3.5 JR4 ANG PIG MP (CATHETERS) ×1 IMPLANT
CATH VISTA GUIDE 6FR XB3 (CATHETERS) ×2 IMPLANT
DEVICE RAD COMP TR BAND LRG (VASCULAR PRODUCTS) ×1 IMPLANT
GLIDESHEATH SLEND SS 6F .021 (SHEATH) ×1 IMPLANT
GUIDEWIRE INQWIRE 1.5J.035X260 (WIRE) IMPLANT
INQWIRE 1.5J .035X260CM (WIRE) ×2
KIT ENCORE 26 ADVANTAGE (KITS) ×1 IMPLANT
KIT HEART LEFT (KITS) ×2 IMPLANT
PACK CARDIAC CATHETERIZATION (CUSTOM PROCEDURE TRAY) ×2 IMPLANT
STENT ONYX FRONTIER 3.0X15 (Permanent Stent) ×1 IMPLANT
TRANSDUCER W/STOPCOCK (MISCELLANEOUS) ×2 IMPLANT
TUBING CIL FLEX 10 FLL-RA (TUBING) ×2 IMPLANT
WIRE RUNTHROUGH .014X180CM (WIRE) ×1 IMPLANT

## 2021-03-18 NOTE — Discharge Summary (Signed)
Discharge Summary    Patient ID: ARADIA Lopez MRN: [id number]; DOB: February 06, 1961  Admit date: Apr 01, 2021 Discharge date: 04/01/21  PCP:  Oneita Hurt, No   [facility] Providers Cardiologist:  Little Ishikawa, MD   Discharge Diagnoses    Principal Problem:   Unstable angina Penny Lopez) Active Problems:   Gastroesophageal reflux disease   Cigarette smoker   CAD (coronary artery disease)   Diagnostic Studies/Procedures    Heart cath 01-Apr-2021: Conclusions: Severe single-vessel coronary artery disease with 95% proximal LCx stenosis.  There is mild-moderate, nonobstructive disease involving the LAD, mid LCx/OM2, and mid RCA. Normal left ventricular systolic function and filling pressure. Successful PCI to proximal LCx using Onyx Frontier 3.0 x 15 mm drug-eluting stent (postdilated to 3.4 mm) with 0% residual stenosis and TIMI-3 flow.   Recommendations: Dual antiplatelet therapy with aspirin and ticagrelor for up to 12 months. Aggressive secondary prevention including high intensity statin therapy.  Lipid panel and ALT have been drawn today to establish baseline values.  Tobacco cessation also encouraged. If no post catheterization complications over the next 6 hours, anticipate same-day discharge with close outpatient follow-up with Dr. Tomie China.  Diagnostic Dominance: Right Intervention     _____________   History of Present Illness     Penny Lopez is a 60 y.o. female who was seen for the evaluation of chest pain. She has past medical history of heavy smoking since young age.  She denies any history of hypertension dyslipidemia or diabetes mellitus.  She mentions to me that she is experiencing substernal chest tightness radiating to the left arm.  This is of significant concern to her before she went to the emergency room.  I reviewed emergency room records.  Troponin was elevated.  Subsequently she was sent home and she went back to work.  She tells me that she has had 2  episodes which were very significant and she almost had to stop what she was doing.  At the time of my evaluation, the patient is alert awake oriented and in no distress.  She does not have any nitroglycerin tablets with her.  At this time she is comfortable.  Given her symptoms concerning for angina, she was scheduled for outpatient angiography.   Hospital Course     Consultants: none  CAD Heart cath showed severe single vessel disease with 95% proximal LCX stenosis successfully treated with DES. Mild to moderate nonobstructive disease in the LAD, mid LCX/OM2, and mid RCA. She tolerated the procedure well and was discharged on ASA and brilinta, Lipitor 80 mgm 12.5 mg lopressor tartrate BID.    Hyperlipidemia with LDL goal < 70 2021-04-01: Cholesterol 113; HDL 34; LDL Cholesterol 74; Triglycerides 25; VLDL 5 Increase to 80 mg lipitor. Recheck labs in 6 weeks.    Current smoker Encouraged cessation.     Did the patient have an acute coronary syndrome (MI, NSTEMI, STEMI, etc) this admission?:  No                               Did the patient have a percutaneous coronary intervention (stent / angioplasty)?:  Yes.     Cath/PCI Registry Performance & Quality Measures: Aspirin prescribed? - Yes ADP Receptor Inhibitor (Plavix/Clopidogrel, Brilinta/Ticagrelor or Effient/Prasugrel) prescribed (includes medically managed patients)? - Yes High Intensity Statin (Lipitor 40-80mg  or Crestor 20-40mg ) prescribed? - Yes For EF <40%, was ACEI/ARB prescribed? - Not Applicable (EF >/= 40%) For EF <40%, Aldosterone Antagonist (  Spironolactone or Eplerenone) prescribed? - Not Applicable (EF >/= 40%) Cardiac Rehab Phase II ordered? - Yes      _____________  Discharge Vitals Blood pressure 111/74, pulse 79, temperature 98.1 F (36.7 C), temperature source Oral, resp. rate 18, height 5\' 11"  (1.803 m), weight 92.1 kg, SpO2 100 %.  Filed Weights   03/18/21 0521  Weight: 92.1 kg    Labs & Radiologic  Studies    CBC Recent Labs    03/16/21 1624  WBC 7.1  NEUTROABS 4.9  HGB 14.0  HCT 40.6  MCV 99*  PLT 189   Basic Metabolic Panel Recent Labs    03/18/21 1624  NA 136  K 4.1  CL 99  CO2 20  GLUCOSE 81  BUN 10  CREATININE 0.72  CALCIUM 9.1   Liver Function Tests Recent Labs    03/18/21 0837  ALT 12   No results for input(s): LIPASE, AMYLASE in the last 72 hours. High Sensitivity Troponin:   Recent Labs  Lab 03/13/21 1929 03/13/21 2109  TROPONINIHS 125* 129*    BNP Invalid input(s): POCBNP D-Dimer No results for input(s): DDIMER in the last 72 hours. Hemoglobin A1C No results for input(s): HGBA1C in the last 72 hours. Fasting Lipid Panel Recent Labs    03/18/21 0837  CHOL 113  HDL 34*  LDLCALC 74  TRIG 25  CHOLHDL 3.3   Thyroid Function Tests No results for input(s): TSH, T4TOTAL, T3FREE, THYROIDAB in the last 72 hours.  Invalid input(s): FREET3 _____________  DG Chest 2 View  Result Date: 03/13/2021 CLINICAL DATA:  Mid chest pain since Wednesday EXAM: CHEST - 2 VIEW COMPARISON:  10/13/2014 FINDINGS: The heart size and mediastinal contours are within normal limits. Both lungs are clear. The visualized skeletal structures are unremarkable. IMPRESSION: No active cardiopulmonary disease. Electronically Signed   By: 10/15/2014 M.D.   On: 03/13/2021 19:29   CARDIAC CATHETERIZATION  Result Date: 03/18/2021 Conclusions: Severe single-vessel coronary artery disease with 95% proximal LCx stenosis.  There is mild-moderate, nonobstructive disease involving the LAD, mid LCx/OM2, and mid RCA. Normal left ventricular systolic function and filling pressure. Successful PCI to proximal LCx using Onyx Frontier 3.0 x 15 mm drug-eluting stent (postdilated to 3.4 mm) with 0% residual stenosis and TIMI-3 flow. Recommendations: Dual antiplatelet therapy with aspirin and ticagrelor for up to 12 months. Aggressive secondary prevention including high intensity statin  therapy.  Lipid panel and ALT have been drawn today to establish baseline values.  Tobacco cessation also encouraged. If no post catheterization complications over the next 6 hours, anticipate same-day discharge with close outpatient follow-up with Dr. 03/20/2021. Tomie China, MD [facility]  Disposition   Pt is being discharged home today in good condition.  Follow-up Plans & Appointments     Follow-up Information     Revankar, [facility], MD Follow up in 2 week(s).   Specialty: Cardiology Contact information: [address] [place] [phone]                Discharge Instructions     Amb Referral to Cardiac Rehabilitation   Complete by: As directed    Diagnosis:  Coronary Stents PTCA     After initial evaluation and assessments completed: Virtual Based Care may be provided alone or in conjunction with Phase 2 Cardiac Rehab based on patient barriers.: Yes       Discharge Medications   Allergies as of 03/18/2021   No Known Allergies  Medication List     STOP taking these medications    ibuprofen 200 MG tablet Commonly known as: ADVIL       TAKE these medications    aspirin EC 81 MG tablet Take 1 tablet (81 mg total) by mouth daily. Swallow whole. What changed: when to take this   atorvastatin 80 MG tablet Commonly known as: Lipitor Take 1 tablet (80 mg total) by mouth daily.   Brilinta 90 MG Tabs tablet Generic drug: ticagrelor Take 1 tablet (90 mg total) by mouth 2 (two) times daily.   cholecalciferol 25 MCG (1000 UNIT) tablet Commonly known as: VITAMIN D Take 1,000 Units by mouth every evening.   esomeprazole 20 MG capsule Commonly known as: NEXIUM Take 20 mg by mouth every evening.   metoprolol tartrate 25 MG tablet Commonly known as: LOPRESSOR Take 0.5 tablets (12.5 mg total) by mouth 2 (two) times daily.   nitroGLYCERIN 0.4 MG SL tablet Commonly known as: NITROSTAT Place 1 tablet (0.4 mg total) under the tongue  every 5 (five) minutes as needed.           Outstanding Labs/Studies   none  Duration of Discharge Encounter   Greater than 30 minutes including physician time.  Signed, Roe Rutherford Afsheen Antony, PA 03/18/2021, 2:03 PM

## 2021-03-18 NOTE — Research (Signed)
Identify Informed Consent   Subject Name: Penny Lopez  Subject met inclusion and exclusion criteria.  The informed consent form, study requirements and expectations were reviewed with the subject and questions and concerns were addressed prior to the signing of the consent form.  The subject verbalized understanding of the trial requirements.  The subject agreed to participate in the Identify trial and signed the informed consent at 0628 on 18-Mar-2021.  The informed consent was obtained prior to performance of any protocol-specific procedures for the subject.  A copy of the signed informed consent was given to the subject and a copy was placed in the subject's medical record.   Jasmine Pang, RN BSN [facility] Cardiovascular Research & Education Direct Line: [phone]

## 2021-03-18 NOTE — Progress Notes (Addendum)
Discussed stent, Brilinta importance, restrictions, smoking cessation, diet, exercise, stress relief, NTG, and CRPII. Pt voiced understanding. She voices she is stressed and I encouraged exercise and making time for herself. She quit smoking 5 days ago and is motivated to stay quit. Will refer to [facility] CRPII and might be best suited for virtual. [id number] Penny Lopez CES, ACSM 11:03 AM 03/18/2021

## 2021-03-18 NOTE — Interval H&P Note (Signed)
History and Physical Interval Note:  03/18/2021 7:12 AM  Penny Lopez  has presented today for surgery, with the diagnosis of unstable angina.  The various methods of treatment have been discussed with the patient and family. After consideration of risks, benefits and other options for treatment, the patient has consented to  Procedure(s): LEFT HEART CATH AND CORONARY ANGIOGRAPHY (N/A) as a surgical intervention.  The patient's history has been reviewed, patient examined, no change in status, stable for surgery.  I have reviewed the patient's chart and labs.  Questions were answered to the patient's satisfaction.    Cath Lab Visit (complete for each Cath Lab visit)  Clinical Evaluation Leading to the Procedure:   ACS: Yes.    Non-ACS:    Anginal Classification: CCS IV  Anti-ischemic medical therapy: No Therapy  Non-Invasive Test Results: No non-invasive testing performed  Prior CABG: No previous CABG  [name]

## 2021-03-19 ENCOUNTER — Telehealth: Payer: Self-pay | Admitting: Cardiology

## 2021-03-19 ENCOUNTER — Encounter (HOSPITAL_COMMUNITY): Payer: Self-pay | Admitting: Internal Medicine

## 2021-03-19 ENCOUNTER — Telehealth: Payer: Self-pay | Admitting: Internal Medicine

## 2021-03-19 NOTE — Telephone Encounter (Signed)
Spoke to the patient just now and I let her know that I would advise not to take this medication again at this time. She tells me that last night she thought she was going to have to go be evaluated somewhere because she was having trouble breathing after taking it. She is much better today but states that she still does not feel 100% back to normal. She is going to take some benadryl now.   I will route to Dr. Tomie China to see if he has any further recommendations for an alternative that she can take.

## 2021-03-19 NOTE — Telephone Encounter (Signed)
error 

## 2021-03-19 NOTE — Telephone Encounter (Signed)
Pt c/o medication issue:  1. Name of Medication: ticagrelor (BRILINTA) 90 MG TABS tablet  2. How are you currently taking this medication (dosage and times per day)? Started taking it yesterday following a procedure  3. Are you having a reaction (difficulty breathing--STAT)? Yes difficulty breathing last night  4. What is your medication issue? Pt started taking this medicine following a procedure and she started having diff breathing last night. Pt does not have Diff breathing right now

## 2021-03-19 NOTE — Telephone Encounter (Signed)
Pt c/o medication issue:  1. Name of Medication: ticagrelor (BRILINTA) 90 MG TABS tablet  2. How are you currently taking this medication (dosage and times per day)? Take 1 tablet (90 mg total) by mouth 2 (two) times daily.  3. Are you having a reaction (difficulty breathing--STAT)? yes  4. What is your medication issue? When patient took the medicine last night, she had a real difficult time breathing wasn't able to sleep last night.

## 2021-03-19 NOTE — Telephone Encounter (Signed)
Spoke with pt at 1350 and advised to take the Brilinta per Dr. Tomie China. Pt advised that we will call back and check on her in 1 hours. I have called and left the pt a message. Called pt back and she states that she is okay at this time.

## 2021-03-19 NOTE — Progress Notes (Signed)
During post op followup call pt states she woke up SOB During the night.  Pt called cardiologist office in [place] awaiting follow up.  Dr. Laurelyn Sickle office notified.

## 2021-03-19 NOTE — Telephone Encounter (Signed)
Duplicate phone note-Dr. Tomie China patient.  See other note

## 2021-03-24 ENCOUNTER — Telehealth (HOSPITAL_COMMUNITY): Payer: Self-pay

## 2021-03-24 NOTE — Telephone Encounter (Deleted)
Pt insurance is active and benefits verified through [facility] Co-pay $25, DED $500/0 met, out of pocket $3,000/$533.08 met, co-insurance 0%. no pre-authorization required. Passport, 03/24/2021@3 :36pm, REF# [id number]   Will contact patient to see if she is interested in the Cardiac Rehab Program. If interested, patient will need to complete follow up appt. Once completed, patient will be contacted for scheduling upon review by the RN Navigator.

## 2021-03-24 NOTE — Telephone Encounter (Signed)
Pt stated that she doesn't have the time to do cardiac rehab right now. Advised pt to give a call if anything changes. Closed referral.

## 2021-03-24 NOTE — Telephone Encounter (Signed)
Pt insurance is active and benefits verified through [facility] Co-pay $25, DED $500/0 met, out of pocket $3,000/$533.08 met, co-insurance 0%. no pre-authorization required. Passport, 03/24/2021@3 :36pm, REF# [id number]  Will contact patient to see if she is interested in the Cardiac Rehab Program. If interested, patient will need to complete follow up appt. Once completed, patient will be contacted for scheduling upon review by the RN Navigator.

## 2021-03-25 ENCOUNTER — Other Ambulatory Visit: Payer: Self-pay

## 2021-04-01 ENCOUNTER — Other Ambulatory Visit: Payer: Self-pay

## 2021-04-01 ENCOUNTER — Ambulatory Visit: Payer: 59 | Admitting: Cardiology

## 2021-04-01 ENCOUNTER — Encounter: Payer: Self-pay | Admitting: Cardiology

## 2021-04-01 VITALS — BP 126/70 | Ht 71.0 in | Wt 202.6 lb

## 2021-04-01 DIAGNOSIS — I2510 Atherosclerotic heart disease of native coronary artery without angina pectoris: Secondary | ICD-10-CM

## 2021-04-01 DIAGNOSIS — R748 Abnormal levels of other serum enzymes: Secondary | ICD-10-CM

## 2021-04-01 DIAGNOSIS — F17210 Nicotine dependence, cigarettes, uncomplicated: Secondary | ICD-10-CM

## 2021-04-01 DIAGNOSIS — I209 Angina pectoris, unspecified: Secondary | ICD-10-CM

## 2021-04-01 DIAGNOSIS — Z955 Presence of coronary angioplasty implant and graft: Secondary | ICD-10-CM | POA: Diagnosis not present

## 2021-04-01 NOTE — Progress Notes (Signed)
Cardiology Office Note:    Date:  04/01/2021   ID:  Penny Lopez, DOB 22-Oct-1960, MRN [id number]  PCP:  Pcp, No  Cardiologist:  Garwin Brothers, MD   Referring MD: No ref. provider found    ASSESSMENT:    1. Coronary artery disease involving native coronary artery of native heart without angina pectoris   2. Cigarette smoker   3. Angina pectoris (HCC)   4. Status post coronary artery stent placement    PLAN:    In order of problems listed above:  Coronary artery disease: Coronary angiography results were discussed with the patient at extensive length and questions were answered to her satisfaction.  Secondary prevention stressed.  Patient was advised to walk at least half an hour a day 5 days a week and she promises to do so. Cigarette smoker: Smoking cessation counseling.  She has not smoked cigarettes since coronary angiography and stenting and I congratulated her about the same.  I spent 5 minutes with the patient discussing solely about smoking. Smoking cessation was counseled. I suggested to the patient also different medications and pharmacological interventions. Patient is keen to try stopping on its own at this time. she will get back to me if he needs any further assistance in this matter. I reviewed her lipids and they are fine but she is on statin therapy and this will help because of anti-inflammatory and pleiotropic actions statin therapy and I discussed this with her and she understands.  She will be back in 1 month for liver lipid check and a Chem-7. Patient will be seen in follow-up appointment in 6 months or earlier if the patient has any concerns    Medication Adjustments/Labs and Tests Ordered: Current medicines are reviewed at length with the patient today.  Concerns regarding medicines are outlined above.  Orders Placed This Encounter  Procedures   Basic metabolic panel   Hepatic function panel   Lipid panel    No orders of the defined types were placed  in this encounter.    No chief complaint on file.    History of Present Illness:    Penny Lopez is a 60 y.o. female.  Patient has past medical history of anginal chest pain and she was sent for coronary angiography.  Subsequently she has done well.  Coronary angiography report mentioned at length below.  She underwent stenting and now is relieved her symptoms.  Since hospital discharge she has quit smoking.  At the time of my evaluation, the patient is alert awake oriented and in no distress.  She is walking on a regular basis.  Past Medical History:  Diagnosis Date   Acid reflux    CAD (coronary artery disease) 03/18/2021   Cigarette smoker 03/16/2021   Gastroesophageal reflux disease 05/30/2018   Unstable angina (HCC) 03/16/2021    Past Surgical History:  Procedure Laterality Date   CHOLECYSTECTOMY     CORONARY STENT INTERVENTION N/A 03/18/2021   Procedure: CORONARY STENT INTERVENTION;  Surgeon: Yvonne Kendall, MD;  Location: [facility] INVASIVE CV LAB;  Service: Cardiovascular;  Laterality: N/A;   LEFT HEART CATH AND CORONARY ANGIOGRAPHY N/A 03/18/2021   Procedure: LEFT HEART CATH AND CORONARY ANGIOGRAPHY;  Surgeon: Yvonne Kendall, MD;  Location: [facility] INVASIVE CV LAB;  Service: Cardiovascular;  Laterality: N/A;    Current Medications: Current Meds  Medication Sig   aspirin EC 81 MG tablet Take 1 tablet (81 mg total) by mouth daily. Swallow whole.   atorvastatin (LIPITOR) 80 MG  tablet Take 1 tablet (80 mg total) by mouth daily.   cholecalciferol (VITAMIN D) 25 MCG (1000 UNIT) tablet Take 1,000 Units by mouth every evening.   esomeprazole (NEXIUM) 20 MG capsule Take 20 mg by mouth every evening.   metoprolol tartrate (LOPRESSOR) 25 MG tablet Take 0.5 tablets (12.5 mg total) by mouth 2 (two) times daily.   nitroGLYCERIN (NITROSTAT) 0.4 MG SL tablet Place 0.4 mg under the tongue every 5 (five) minutes as needed for chest pain.   ticagrelor (BRILINTA) 90 MG TABS tablet Take 1 tablet (90  mg total) by mouth 2 (two) times daily.     Allergies:   Patient has no known allergies.   Social History   Socioeconomic History   Marital status: Married    Spouse name: Not on file   Number of children: Not on file   Years of education: Not on file   Highest education level: Not on file  Occupational History   Not on file  Tobacco Use   Smoking status: Every Day    Types: Cigarettes   Smokeless tobacco: Never  Substance and Sexual Activity   Alcohol use: No   Drug use: No   Sexual activity: Not on file  Other Topics Concern   Not on file  Social History Narrative   Not on file   Social Determinants of Health   Financial Resource Strain: Not on file  Food Insecurity: Not on file  Transportation Needs: Not on file  Physical Activity: Not on file  Stress: Not on file  Social Connections: Not on file     Family History: The patient's family history includes Atrial fibrillation in her mother; Heart failure in her father. There is no history of Hypertension, Diabetes, Cancer, or Heart disease.  ROS:   Please see the history of present illness.    All other systems reviewed and are negative.  EKGs/Labs/Other Studies Reviewed:    The following studies were reviewed today:  End, Christopher, MD (Primary)     Procedures  CORONARY STENT INTERVENTION  LEFT HEART CATH AND CORONARY ANGIOGRAPHY   Conclusion  Conclusions: Severe single-vessel coronary artery disease with 95% proximal LCx stenosis.  There is mild-moderate, nonobstructive disease involving the LAD, mid LCx/OM2, and mid RCA. Normal left ventricular systolic function and filling pressure. Successful PCI to proximal LCx using Onyx Frontier 3.0 x 15 mm drug-eluting stent (postdilated to 3.4 mm) with 0% residual stenosis and TIMI-3 flow.   Recommendations: Dual antiplatelet therapy with aspirin and ticagrelor for up to 12 months. Aggressive secondary prevention including high intensity statin therapy.   Lipid panel and ALT have been drawn today to establish baseline values.  Tobacco cessation also encouraged. If no post catheterization complications over the next 6 hours, anticipate same-day discharge with close outpatient follow-up with Dr. Tomie China.   Yvonne Kendall, MD [facility]  Recent Labs: 03/16/2021: BUN 10; Creatinine, Ser 0.72; Hemoglobin 14.0; Platelets 189; Potassium 4.1; Sodium 136 03/18/2021: ALT 12  Recent Lipid Panel    Component Value Date/Time   CHOL 113 03/18/2021 0837   TRIG 25 03/18/2021 0837   HDL 34 (L) 03/18/2021 0837   CHOLHDL 3.3 03/18/2021 0837   VLDL 5 03/18/2021 0837   LDLCALC 74 03/18/2021 0837    Physical Exam:    VS:  BP 126/70   Ht 5\' 11"  (1.803 m)   Wt 202 lb 9.6 oz (91.9 kg)   SpO2 97%   BMI 28.26 kg/m     Wt Readings  from Last 3 Encounters:  04/01/21 202 lb 9.6 oz (91.9 kg)  03/18/21 203 lb (92.1 kg)  03/16/21 205 lb 6.4 oz (93.2 kg)     GEN: Patient is in no acute distress HEENT: Normal NECK: No JVD; No carotid bruits LYMPHATICS: No lymphadenopathy CARDIAC: Hear sounds regular, 2/6 systolic murmur at the apex. RESPIRATORY:  Clear to auscultation without rales, wheezing or rhonchi  ABDOMEN: Soft, non-tender, non-distended MUSCULOSKELETAL:  No edema; No deformity  SKIN: Warm and dry NEUROLOGIC:  Alert and oriented x 3 PSYCHIATRIC:  Normal affect   Signed, Garwin Brothers, MD  04/01/2021 12:50 PM    [facility]

## 2021-04-01 NOTE — Patient Instructions (Signed)

## 2021-04-05 ENCOUNTER — Telehealth (HOSPITAL_COMMUNITY): Payer: Self-pay

## 2021-04-05 ENCOUNTER — Other Ambulatory Visit (HOSPITAL_COMMUNITY): Payer: Self-pay

## 2021-04-05 NOTE — Telephone Encounter (Signed)
Pharmacy Transitions of Care Follow-up Telephone Call  Date of discharge: 03/18/21  Discharge Diagnosis: stent placement  How have you been since you were released from the hospital?  Patient has been well since discharge. No questions about meds at this time.  Medication changes made at discharge:     START taking: atorvastatin (Lipitor)  metoprolol tartrate (LOPRESSOR)  Brilinta STOP taking: ibuprofen 200 MG tablet (ADVIL)   Medication changes verified by the patient? Yes    Medication Accessibility:  Home Pharmacy: not discussed   Was the patient provided with refills on discharged medications? No, medication already sent to home pharmacy   Have all prescriptions been transferred from [facility] to home pharmacy? N/A   Is the patient able to afford medications? Has insurance    Medication Review:  TICAGRELOR (BRILINTA) Ticagrelor 90 mg BID initiated on 03/18/21.  - Educated patient on expected duration of therapy of aspirin with ticagrelor.  - Discussed importance of taking medication around the same time every day, - Advised patient of medications to avoid (NSAIDs, aspirin maintenance doses>100 mg daily) - Educated that Tylenol (acetaminophen) will be the preferred analgesic to prevent risk of bleeding  - Emphasized importance of monitoring for signs and symptoms of bleeding (abnormal bruising, prolonged bleeding, nose bleeds, bleeding from gums, discolored urine, black tarry stools)  - Educated patient to notify doctor if shortness of breath or abnormal heartbeat occur - Advised patient to alert all providers of antiplatelet therapy prior to starting a new medication or having a procedure   Follow-up Appointments:  PCP Hospital f/u appt confirmed? None currently scheduled  Specialist Hospital f/u appt confirmed? Scheduled to see Dr. Tomie China on 04/01/21 @ Cardiology.   If their condition worsens, is the pt aware to call PCP or go to the Emergency Dept.? yes  Final Patient  Assessment: Patient has follow up scheduled and refills at home pharmacy

## 2021-04-21 ENCOUNTER — Ambulatory Visit: Payer: 59 | Admitting: Cardiology

## 2021-05-05 LAB — BASIC METABOLIC PANEL
BUN/Creatinine Ratio: 16 (ref 9–23)
BUN: 11 mg/dL (ref 6–24)
CO2: 21 mmol/L (ref 20–29)
Calcium: 9.4 mg/dL (ref 8.7–10.2)
Chloride: 101 mmol/L (ref 96–106)
Creatinine, Ser: 0.69 mg/dL (ref 0.57–1.00)
Glucose: 99 mg/dL (ref 70–99)
Potassium: 4.3 mmol/L (ref 3.5–5.2)
Sodium: 136 mmol/L (ref 134–144)
eGFR: 101 mL/min/{1.73_m2} (ref >59)

## 2021-05-05 LAB — HEPATIC FUNCTION PANEL
ALT: 124 IU/L — ABNORMAL HIGH (ref 0–32)
AST: 83 IU/L — ABNORMAL HIGH (ref 0–40)
Albumin: 4.4 g/dL (ref 3.8–4.9)
Alkaline Phosphatase: 213 IU/L — ABNORMAL HIGH (ref 44–121)
Bilirubin Total: 0.7 mg/dL (ref 0.0–1.2)
Bilirubin, Direct: 0.21 mg/dL (ref 0.00–0.40)
Total Protein: 6.9 g/dL (ref 6.0–8.5)

## 2021-05-05 LAB — LIPID PANEL
Chol/HDL Ratio: 2.7 ratio (ref 0.0–4.4)
Cholesterol, Total: 110 mg/dL (ref 100–199)
HDL: 41 mg/dL (ref >39)
LDL Chol Calc (NIH): 50 mg/dL (ref 0–99)
Triglycerides: 97 mg/dL (ref 0–149)
VLDL Cholesterol Cal: 19 mg/dL (ref 5–40)

## 2021-05-06 ENCOUNTER — Other Ambulatory Visit: Payer: Self-pay | Admitting: Cardiology

## 2021-05-06 MED ORDER — ATORVASTATIN CALCIUM 10 MG PO TABS: 80.0000 mg | ORAL_TABLET | Freq: Every day | ORAL | 11 refills | Status: DC

## 2021-05-06 NOTE — Addendum Note (Signed)
Addended by: Eleonore Chiquito on: 05/06/2021 08:20 AM   Modules accepted: Orders

## 2021-06-02 ENCOUNTER — Other Ambulatory Visit: Payer: Self-pay | Admitting: Neurosurgery

## 2021-06-02 DIAGNOSIS — M5136 Other intervertebral disc degeneration, lumbar region: Secondary | ICD-10-CM

## 2021-06-10 ENCOUNTER — Ambulatory Visit
Admission: RE | Admit: 2021-06-10 | Discharge: 2021-06-10 | Disposition: A | Discharge status: Home / Self Care | Payer: 59 | Source: Ambulatory Visit | Attending: Neurosurgery | Admitting: Neurosurgery

## 2021-06-10 DIAGNOSIS — M5136 Other intervertebral disc degeneration, lumbar region: Secondary | ICD-10-CM

## 2021-07-27 ENCOUNTER — Other Ambulatory Visit: Payer: Self-pay | Admitting: Cardiology

## 2021-07-30 ENCOUNTER — Other Ambulatory Visit: Payer: Self-pay

## 2021-07-30 ENCOUNTER — Encounter: Payer: Self-pay | Admitting: Nurse Practitioner

## 2021-07-30 ENCOUNTER — Ambulatory Visit: Payer: 59 | Admitting: Nurse Practitioner

## 2021-07-30 VITALS — BP 124/68 | HR 71 | Temp 97.9°F | Ht 71.0 in | Wt 210.0 lb

## 2021-07-30 DIAGNOSIS — Z131 Encounter for screening for diabetes mellitus: Secondary | ICD-10-CM | POA: Diagnosis not present

## 2021-07-30 DIAGNOSIS — E663 Overweight: Secondary | ICD-10-CM | POA: Diagnosis not present

## 2021-07-30 DIAGNOSIS — I2510 Atherosclerotic heart disease of native coronary artery without angina pectoris: Secondary | ICD-10-CM

## 2021-07-30 DIAGNOSIS — R748 Abnormal levels of other serum enzymes: Secondary | ICD-10-CM

## 2021-07-30 DIAGNOSIS — Z13220 Encounter for screening for lipoid disorders: Secondary | ICD-10-CM | POA: Diagnosis not present

## 2021-07-30 DIAGNOSIS — Z1329 Encounter for screening for other suspected endocrine disorder: Secondary | ICD-10-CM

## 2021-07-30 NOTE — Progress Notes (Signed)
Subjective:  Patient ID: Penny Lopez, female    DOB: 03/05/1961  Age: 61 y.o. MRN: [id number]  CC:  Chief Complaint  Patient presents with   New Patient (Initial Visit)    Physical, pt would like blood work done      HPI  This patient arrives today for the above.  Her main concern she was told she has elevated liver enzymes needs to have this monitored by primary care provider.  She recently had stent placement to coronary artery late last year and was also started on statin therapy by her cardiologist.  Upon follow-up with cardiologist she had blood work drawn which did show elevated alkaline phosphatase, AST, and ALT.  She is not having any abdominal pain currently.  She is now being established as a new patient here because cardiologist recommended she follow-up on her those elevated liver enzymes.  She is now on a lower dose of statin therapy since those elevated liver enzymes were found.  She is tolerating it well.  She is on Brilinta as her antiplatelet agent, she tells me it causes intermittent dyspnea but otherwise she tolerates it well.  If there is a different treatment option she would be open to trying it due to the dyspnea.  She is overdue for mammogram, Pap smear, and colonoscopy for routine screening but for now would like to hold off on these.  She is also not fasting today.  Past Medical History:  Diagnosis Date   Acid reflux    CAD (coronary artery disease) 03/18/2021   Cigarette smoker 03/16/2021   Gastroesophageal reflux disease 05/30/2018   Unstable angina (HCC) 03/16/2021      Family History  Problem Relation Age of Onset   Atrial fibrillation Mother    Heart failure Father    Hypertension Neg Hx    Diabetes Neg Hx    Cancer Neg Hx    Heart disease Neg Hx     Social History   Social History Narrative   Not on file   Social History   Tobacco Use   Smoking status: Former    Types: Cigarettes    Quit date: 03/16/2021    Years since quitting:  0.3   Smokeless tobacco: Never  Substance Use Topics   Alcohol use: No     Current Meds  Medication Sig   aspirin EC 81 MG tablet Take 1 tablet (81 mg total) by mouth daily. Swallow whole.   atorvastatin (LIPITOR) 10 MG tablet Take 1 tablet (10 mg total) by mouth daily.   cholecalciferol (VITAMIN D) 25 MCG (1000 UNIT) tablet Take 1,000 Units by mouth every evening.   esomeprazole (NEXIUM) 20 MG capsule Take 20 mg by mouth every evening.   metoprolol tartrate (LOPRESSOR) 25 MG tablet Take 0.5 tablets (12.5 mg total) by mouth 2 (two) times daily.   nitroGLYCERIN (NITROSTAT) 0.4 MG SL tablet Place 0.4 mg under the tongue every 5 (five) minutes as needed for chest pain.   ticagrelor (BRILINTA) 90 MG TABS tablet Take 1 tablet (90 mg total) by mouth 2 (two) times daily.    ROS:  Review of Systems  Constitutional:  Negative for fever, malaise/fatigue and weight loss.  Respiratory:  Negative for cough and shortness of breath.   Cardiovascular:  Negative for chest pain and palpitations.  Gastrointestinal:  Negative for abdominal pain and blood in stool.  Neurological:  Negative for dizziness and headaches.  Psychiatric/Behavioral:  Negative for suicidal ideas.  Objective:   Today's Vitals: BP 124/68    Pulse 71    Temp 97.9 F (36.6 C) (Oral)    Ht 5\' 11"  (1.803 m)    Wt 210 lb (95.3 kg)    SpO2 96%    BMI 29.29 kg/m  Vitals with BMI 07/30/2021 04/01/2021 03/18/2021  Height 5\' 11"  5\' 11"  -  Weight 210 lbs 202 lbs 10 oz -  BMI 29.3 28.27 -  Systolic 124 126 03/20/2021  Diastolic 68 70 81  Pulse 71 - 78     Physical Exam Vitals reviewed.  Constitutional:      General: She is not in acute distress.    Appearance: Normal appearance.  HENT:     Head: Normocephalic and atraumatic.  Neck:     Vascular: No carotid bruit.  Cardiovascular:     Rate and Rhythm: Normal rate and regular rhythm.     Pulses: Normal pulses.     Heart sounds: Normal heart sounds.  Pulmonary:     Effort:  Pulmonary effort is normal.     Breath sounds: Normal breath sounds.  Skin:    General: Skin is warm and dry.  Neurological:     General: No focal deficit present.     Mental Status: She is alert and oriented to person, place, and time.  Psychiatric:        Mood and Affect: Mood normal.        Behavior: Behavior normal.        Judgment: Judgment normal.         Assessment and Plan   1. Elevated liver enzymes   2. Overweight   3. Coronary artery disease involving native coronary artery of native heart without angina pectoris   4. Screening for diabetes mellitus   5. Screening for lipid disorders   6. Screening for thyroid disorder      Plan: 1.  Patient will come back next week to have repeat blood work when she is in the fasting state.  Further recommendations will be made based on these results. 2.,  4.-6.  Blood work will be collected next week for further evaluation. 3.  I recommend she continue taking her Brilinta as long she tolerates it.  If she would like to discuss a different treatment option I will defer to her cardiologist.  I also offered second opinion if she would like that but for now she would like to keep her current cardiologist.   Tests ordered Orders Placed This Encounter  Procedures   TSH   Hemoglobin A1c   Lipid panel   Comprehensive metabolic panel   CBC with Differential/Platelet      No orders of the defined types were placed in this encounter.   Patient to follow-up in 1 month for annual physical exam as well as discuss blood work results, or sooner as needed.  , NP

## 2021-08-02 LAB — COMPREHENSIVE METABOLIC PANEL
ALT: 14 U/L (ref 0–35)
AST: 12 U/L (ref 0–37)
Albumin: 4.3 g/dL (ref 3.5–5.2)
Alkaline Phosphatase: 92 U/L (ref 39–117)
BUN: 11 mg/dL (ref 6–23)
CO2: 27 mEq/L (ref 19–32)
Calcium: 9.2 mg/dL (ref 8.4–10.5)
Chloride: 104 mEq/L (ref 96–112)
Creatinine, Ser: 0.73 mg/dL (ref 0.40–1.20)
GFR: 90.64 mL/min (ref >60.00)
Glucose, Bld: 101 mg/dL — ABNORMAL HIGH (ref 70–99)
Potassium: 4.1 mEq/L (ref 3.5–5.1)
Sodium: 138 mEq/L (ref 135–145)
Total Bilirubin: 0.8 mg/dL (ref 0.2–1.2)
Total Protein: 6.8 g/dL (ref 6.0–8.3)

## 2021-08-02 LAB — CBC WITH DIFFERENTIAL/PLATELET
Basophils Absolute: 0 10*3/uL (ref 0.0–0.1)
Basophils Relative: 0.6 % (ref 0.0–3.0)
Eosinophils Absolute: 0.1 10*3/uL (ref 0.0–0.7)
Eosinophils Relative: 1.2 % (ref 0.0–5.0)
HCT: 40.6 % (ref 36.0–46.0)
Hemoglobin: 13.8 g/dL (ref 12.0–15.0)
Lymphocytes Relative: 21.6 % (ref 12.0–46.0)
Lymphs Abs: 1.7 10*3/uL (ref 0.7–4.0)
MCHC: 34 g/dL (ref 30.0–36.0)
MCV: 96.4 fl (ref 78.0–100.0)
Monocytes Absolute: 0.6 10*3/uL (ref 0.1–1.0)
Monocytes Relative: 7.4 % (ref 3.0–12.0)
Neutro Abs: 5.4 10*3/uL (ref 1.4–7.7)
Neutrophils Relative %: 69.2 % (ref 43.0–77.0)
Platelets: 211 10*3/uL (ref 150.0–400.0)
RBC: 4.21 Mil/uL (ref 3.87–5.11)
RDW: 12.4 % (ref 11.5–15.5)
WBC: 7.9 10*3/uL (ref 4.0–10.5)

## 2021-08-02 LAB — TSH: TSH: 1.69 u[IU]/mL (ref 0.35–5.50)

## 2021-08-02 LAB — LIPID PANEL
Cholesterol: 121 mg/dL (ref 0–200)
HDL: 42.5 mg/dL (ref >39.00)
LDL Cholesterol: 60 mg/dL (ref 0–99)
NonHDL: 78.7
Total CHOL/HDL Ratio: 3
Triglycerides: 95 mg/dL (ref 0.0–149.0)
VLDL: 19 mg/dL (ref 0.0–40.0)

## 2021-08-02 LAB — HEMOGLOBIN A1C: Hgb A1c MFr Bld: 6 % (ref 4.6–6.5)

## 2021-09-09 ENCOUNTER — Encounter: Payer: 59 | Admitting: Nurse Practitioner

## 2021-09-30 ENCOUNTER — Ambulatory Visit (INDEPENDENT_AMBULATORY_CARE_PROVIDER_SITE_OTHER): Payer: 59 | Admitting: Nurse Practitioner

## 2021-09-30 VITALS — BP 122/80 | HR 67 | Temp 98.1°F | Ht 71.0 in | Wt 214.4 lb

## 2021-09-30 DIAGNOSIS — Z0001 Encounter for general adult medical examination with abnormal findings: Secondary | ICD-10-CM

## 2021-09-30 DIAGNOSIS — R7303 Prediabetes: Secondary | ICD-10-CM

## 2021-09-30 HISTORY — DX: Prediabetes: R73.03

## 2021-09-30 HISTORY — DX: Encounter for general adult medical examination with abnormal findings: Z00.01

## 2021-09-30 NOTE — Progress Notes (Signed)
? ? ? ?Subjective:  ?Patient ID: Penny Lopez, female    DOB: June 14, 1961  Age: 61 y.o. MRN: [id number] ? ?CC:  ?Chief Complaint  ?Patient presents with  ? Annual Exam  ?  No concerns  ?  ? ? ?HPI  ?This patient arrives today for the above. ? ?She is due for mammogram, colonoscopy, Pap smear, tetanus shot, hepatitis C screening, zosters vaccine.  At this time she would like to hold off on all for health maintenance because she is experiencing chronic low back pain and is in the middle of treatment for this.  She would prefer to get her back pain situated before focusing on her health maintenance and screenings. ? ?Blood work was collected at last office visit which did show resolution of elevated liver enzymes, stable kidney function, LDL of 60, no anemia, A1c of 6.0, and normal TSH. ? ?Today, she has no acute concerns other than her chronic back pain which is being evaluated and treated by specialist. ? ? ?Past Medical History:  ?Diagnosis Date  ? Acid reflux   ? CAD (coronary artery disease) 03/18/2021  ? Cigarette smoker 03/16/2021  ? Gastroesophageal reflux disease 05/30/2018  ? Unstable angina (HCC) 03/16/2021  ? ? ? ? ?Family History  ?Problem Relation Age of Onset  ? Atrial fibrillation Mother   ? Heart failure Father   ? Hypertension Neg Hx   ? Diabetes Neg Hx   ? Cancer Neg Hx   ? Heart disease Neg Hx   ? ? ?Social History  ? ?Social History Narrative  ? Not on file  ? ?Social History  ? ?Tobacco Use  ? Smoking status: Former  ?  Types: Cigarettes  ?  Quit date: 03/16/2021  ?  Years since quitting: 0.5  ? Smokeless tobacco: Never  ?Substance Use Topics  ? Alcohol use: No  ? ? ? ?Current Meds  ?Medication Sig  ? aspirin EC 81 MG tablet Take 1 tablet (81 mg total) by mouth daily. Swallow whole.  ? atorvastatin (LIPITOR) 10 MG tablet Take 1 tablet (10 mg total) by mouth daily.  ? cholecalciferol (VITAMIN D) 25 MCG (1000 UNIT) tablet Take 1,000 Units by mouth every evening.  ? esomeprazole (NEXIUM) 20 MG capsule  Take 20 mg by mouth every evening.  ? methocarbamol (ROBAXIN) 500 MG tablet Take 500 mg by mouth 3 (three) times daily.  ? metoprolol tartrate (LOPRESSOR) 25 MG tablet Take 0.5 tablets (12.5 mg total) by mouth 2 (two) times daily.  ? nitroGLYCERIN (NITROSTAT) 0.4 MG SL tablet Place 0.4 mg under the tongue every 5 (five) minutes as needed for chest pain.  ? ticagrelor (BRILINTA) 90 MG TABS tablet Take 1 tablet (90 mg total) by mouth 2 (two) times daily.  ? ? ?ROS:  ?Review of Systems  ?Constitutional:  Negative for fever.  ?Respiratory:  Positive for shortness of breath (secondary to brilinta). Negative for cough.   ?Cardiovascular:  Negative for chest pain.  ?Gastrointestinal:  Negative for abdominal pain and blood in stool.  ?Neurological:  Negative for dizziness and headaches.  ? ? ?Objective:  ? ?Today's Vitals: BP 122/80   Pulse 67   Temp 98.1 ?F (36.7 ?C) (Oral)   Ht 5\' 11"  (1.803 m)   Wt 214 lb 6 oz (97.2 kg)   SpO2 98%   BMI 29.90 kg/m?  ? ?  09/30/2021  ?  1:45 PM 07/30/2021  ?  1:58 PM 04/01/2021  ? 10:54 AM  ?Vitals with BMI  ?  Height 5\' 11"  5\' 11"  5\' 11"   ?Weight 214 lbs 6 oz 210 lbs 202 lbs 10 oz  ?BMI 29.91 29.3 28.27  ?Systolic 122 124  ?Diastolic 80 68 70  ?Pulse 67 71   ?  ? ?Physical Exam ?Vitals reviewed.  ?Constitutional:   ?   Appearance: Normal appearance.  ?HENT:  ?   Head: Normocephalic and atraumatic.  ?   Right Ear: Tympanic membrane, ear canal and external ear normal.  ?   Left Ear: Tympanic membrane, ear canal and external ear normal.  ?Eyes:  ?   General:     ?   Right eye: No discharge.     ?   Left eye: No discharge.  ?   Extraocular Movements: Extraocular movements intact.  ?   Conjunctiva/sclera: Conjunctivae normal.  ?   Pupils: Pupils are equal, round, and reactive to light.  ?Neck:  ?   Vascular: No carotid bruit.  ?Cardiovascular:  ?   Rate and Rhythm: Normal rate and regular rhythm.  ?   Pulses: Normal pulses.  ?   Heart sounds: Normal heart sounds. No murmur  heard. ?Pulmonary:  ?   Effort: Pulmonary effort is normal.  ?   Breath sounds: Normal breath sounds.  ?Chest:  ?   Comments: Deferred per patient preference ?Abdominal:  ?   General: Abdomen is flat. Bowel sounds are normal. There is no distension.  ?   Palpations: Abdomen is soft. There is no mass.  ?   Tenderness: There is no abdominal tenderness.  ?Musculoskeletal:     ?   General: No tenderness.  ?   Cervical back: Neck supple. No muscular tenderness.  ?   Right lower leg: No edema.  ?   Left lower leg: No edema.  ?Lymphadenopathy:  ?   Cervical: No cervical adenopathy.  ?   Upper Body:  ?   Right upper body: No supraclavicular adenopathy.  ?   Left upper body: No supraclavicular adenopathy.  ?Skin: ?   General: Skin is warm and dry.  ?Neurological:  ?   General: No focal deficit present.  ?   Mental Status: She is alert and oriented to person, place, and time.  ?   Motor: No weakness.  ?   Gait: Gait normal.  ?Psychiatric:     ?   Mood and Affect: Mood normal.     ?   Behavior: Behavior normal.     ?   Judgment: Judgment normal.  ? ? ? ? ? ? ? ?Assessment and Plan  ? ?1. Encounter for general adult medical examination with abnormal findings   ?2. Prediabetes   ? ? ? ?Plan: ?See plan via problem list below. ? ? ?Tests ordered ?No orders of the defined types were placed in this encounter. ? ? ? ? ?No orders of the defined types were placed in this encounter. ? ? ?Patient to follow-up in 6 months or sooner as needed. ? ? , NP ? ?

## 2021-09-30 NOTE — Assessment & Plan Note (Signed)
Lifestyle modification recommended today.  Because she is unable to participate in much physical activity due to her back pain, she was encouraged to really focus on diet by avoiding processed carbohydrates, eating lean protein sources, and focusing on fresh vegetable/fruit intake.  We did discuss considering pharmacological treatment such as metformin, however for now she would like to focus on lifestyle modification.  May consider rechecking A1c at next office visit. ?

## 2021-09-30 NOTE — Assessment & Plan Note (Signed)
Overall exam is grossly normal.  We will need to discuss breast cancer screening, colon cancer screening, cervical cancer screening, hepatitis C screening, and updating her vaccines at next office visit.  Hopefully at that point her back pain will be improved and she will be open to these routine health maintenance recommendations. ?

## 2021-12-14 ENCOUNTER — Other Ambulatory Visit: Payer: Self-pay

## 2022-03-01 ENCOUNTER — Other Ambulatory Visit: Payer: Self-pay

## 2022-03-02 ENCOUNTER — Ambulatory Visit: Payer: 59 | Attending: Cardiology | Admitting: Cardiology

## 2022-03-02 ENCOUNTER — Encounter: Payer: Self-pay | Admitting: Cardiology

## 2022-03-02 VITALS — BP 112/68 | HR 76 | Ht 71.0 in | Wt 207.1 lb

## 2022-03-02 DIAGNOSIS — R7303 Prediabetes: Secondary | ICD-10-CM | POA: Diagnosis not present

## 2022-03-02 DIAGNOSIS — E782 Mixed hyperlipidemia: Secondary | ICD-10-CM

## 2022-03-02 DIAGNOSIS — E559 Vitamin D deficiency, unspecified: Secondary | ICD-10-CM

## 2022-03-02 DIAGNOSIS — I209 Angina pectoris, unspecified: Secondary | ICD-10-CM | POA: Diagnosis not present

## 2022-03-02 DIAGNOSIS — F17210 Nicotine dependence, cigarettes, uncomplicated: Secondary | ICD-10-CM

## 2022-03-02 DIAGNOSIS — I2510 Atherosclerotic heart disease of native coronary artery without angina pectoris: Secondary | ICD-10-CM | POA: Diagnosis not present

## 2022-03-02 DIAGNOSIS — R748 Abnormal levels of other serum enzymes: Secondary | ICD-10-CM

## 2022-03-02 HISTORY — DX: Mixed hyperlipidemia: E78.2

## 2022-03-02 MED ORDER — CLOPIDOGREL BISULFATE 75 MG PO TABS
75.0000 mg | ORAL_TABLET | Freq: Every day | ORAL | 3 refills | Status: DC
Start: 2022-03-02 — End: 2023-02-03

## 2022-03-02 NOTE — Progress Notes (Signed)
Cardiology Office Note:    Date:  03/02/2022   ID:  Penny Lopez, DOB 1961-03-22, MRN [id number]  PCP:  Elenore Paddy, NP  Cardiologist:  Garwin Brothers, MD   Referring MD: Elenore Paddy, NP    ASSESSMENT:    1. Coronary artery disease involving native coronary artery of native heart without angina pectoris   2. Mixed dyslipidemia   3. Prediabetes   4. Angina pectoris (HCC)   5. Elevated liver enzymes   6. Vitamin D deficiency   7. Cigarette smoker    PLAN:    In order of problems listed above:  Coronary artery disease: Secondary prevention stressed to the patient.  Importance of compliance with diet and medication stressed and she vocalized understanding.  She was advised to walk at least half an hour a day 5 days a week and she promises to do so. Mixed dyslipidemia: On lipid-lowering therapy and she will have blood work done today for fasting lipids.  Diet emphasized.  Exercise stressed. Prediabetes: We will do hemoglobin A1c per her request. Sporadic smoker: She smokes rarely.  Risks explained.  I told her to quit completely and she promises to do so. Patient will be seen in follow-up appointment in 6 months or earlier if the patient has any concerns.  She also requests vitamin D levels because of history of deficiency and we will do so.    Medication Adjustments/Labs and Tests Ordered: Current medicines are reviewed at length with the patient today.  Concerns regarding medicines are outlined above.  Orders Placed This Encounter  Procedures   Basic metabolic panel   CBC   Hemoglobin A1c   Hepatic function panel   Lipid panel   TSH   VITAMIN D 25 Hydroxy (Vit-D Deficiency, Fractures)   Meds ordered this encounter  Medications   clopidogrel (PLAVIX) 75 MG tablet    Sig: Take 1 tablet (75 mg total) by mouth daily.    Dispense:  90 tablet    Refill:  3     No chief complaint on file.    History of Present Illness:    Penny Lopez is a 61 y.o. female.   Patient has past medical history of coronary artery disease, mixed dyslipidemia.  She denies any problems at this time and takes care of activities of daily living.  No chest pain orthopnea or PND.  She walks about 4 days a week half an hour a day without any symptoms.  Unfortunately she smokes very sporadically 1 or 2 times a month.  At the time of my evaluation, the patient is alert awake oriented and in no distress.  Past Medical History:  Diagnosis Date   Acid reflux    CAD (coronary artery disease) 03/18/2021   Encounter for general adult medical examination with abnormal findings 09/30/2021   Gastroesophageal reflux disease 05/30/2018   Prediabetes 09/30/2021   Unstable angina (HCC) 03/16/2021    Past Surgical History:  Procedure Laterality Date   CHOLECYSTECTOMY     CORONARY STENT INTERVENTION N/A 03/18/2021   Procedure: CORONARY STENT INTERVENTION;  Surgeon: Yvonne Kendall, MD;  Location: [facility];  Service: Cardiovascular;  Laterality: N/A;   LEFT HEART CATH AND CORONARY ANGIOGRAPHY N/A 03/18/2021   Procedure: LEFT HEART CATH AND CORONARY ANGIOGRAPHY;  Surgeon: Yvonne Kendall, MD;  Location: [facility];  Service: Cardiovascular;  Laterality: N/A;    Current Medications: Current Meds  Medication Sig   aspirin EC 81 MG tablet  Take 1 tablet (81 mg total) by mouth daily. Swallow whole.   atorvastatin (LIPITOR) 10 MG tablet Take 1 tablet (10 mg total) by mouth daily.   cholecalciferol (VITAMIN D) 25 MCG (1000 UNIT) tablet Take 1,000 Units by mouth every evening.   clopidogrel (PLAVIX) 75 MG tablet Take 1 tablet (75 mg total) by mouth daily.   esomeprazole (NEXIUM) 20 MG capsule Take 20 mg by mouth every evening.   metoprolol tartrate (LOPRESSOR) 25 MG tablet Take 0.5 tablets (12.5 mg total) by mouth 2 (two) times daily.   nitroGLYCERIN (NITROSTAT) 0.4 MG SL tablet Place 0.4 mg under the tongue every 5 (five) minutes as needed for chest pain.   [DISCONTINUED]  ticagrelor (BRILINTA) 90 MG TABS tablet Take 1 tablet (90 mg total) by mouth 2 (two) times daily.     Allergies:   Patient has no known allergies.   Social History   Socioeconomic History   Marital status: Married    Spouse name: Not on file   Number of children: Not on file   Years of education: Not on file   Highest education level: Not on file  Occupational History   Not on file  Tobacco Use   Smoking status: Former    Types: Cigarettes    Quit date: 03/16/2021    Years since quitting: 0.9   Smokeless tobacco: Never  Substance and Sexual Activity   Alcohol use: No   Drug use: No   Sexual activity: Not on file  Other Topics Concern   Not on file  Social History Narrative   Not on file   Social Determinants of Health   Financial Resource Strain: Not on file  Food Insecurity: Not on file  Transportation Needs: Not on file  Physical Activity: Not on file  Stress: Not on file  Social Connections: Not on file     Family History: The patient's family history includes Atrial fibrillation in her mother; Heart failure in her father. There is no history of Hypertension, Diabetes, Cancer, or Heart disease.  ROS:   Please see the history of present illness.    All other systems reviewed and are negative.  EKGs/Labs/Other Studies Reviewed:    The following studies were reviewed today: I discussed my findings with the patient at length   Recent Labs: 08/02/2021: ALT 14; BUN 11; Creatinine, Ser 0.73; Hemoglobin 13.8; Platelets 211.0; Potassium 4.1; Sodium 138; TSH 1.69  Recent Lipid Panel    Component Value Date/Time   CHOL 121 08/02/2021 0948   CHOL 110 05/05/2021 0942   TRIG 95.0 08/02/2021 0948   HDL 42.50 08/02/2021 0948   HDL 41 05/05/2021 0942   CHOLHDL 3 08/02/2021 0948   VLDL 19.0 08/02/2021 0948   LDLCALC 60 08/02/2021 0948   LDLCALC 50 05/05/2021 0942    Physical Exam:    VS:  BP 112/68   Pulse 76   Ht 5\' 11"  (1.803 m)   Wt 207 lb 1.3 oz (93.9 kg)    SpO2 96%   BMI 28.88 kg/m     Wt Readings from Last 3 Encounters:  03/02/22 207 lb 1.3 oz (93.9 kg)  09/30/21 214 lb 6 oz (97.2 kg)  07/30/21 210 lb (95.3 kg)     GEN: Patient is in no acute distress HEENT: Normal NECK: No JVD; No carotid bruits LYMPHATICS: No lymphadenopathy CARDIAC: Hear sounds regular, 2/6 systolic murmur at the apex. RESPIRATORY:  Clear to auscultation without rales, wheezing or rhonchi  ABDOMEN: Soft, non-tender, non-distended  MUSCULOSKELETAL:  No edema; No deformity  SKIN: Warm and dry NEUROLOGIC:  Alert and oriented x 3 PSYCHIATRIC:  Normal affect   Signed, Garwin Brothers, MD  03/02/2022 2:48 PM    [facility]

## 2022-03-02 NOTE — Patient Instructions (Addendum)
Medication Instructions:  Your physician has recommended you make the following change in your medication:   Once you finish your current bottle of Brilinta then start 75 mg Plavix with 81 mg coated aspirin for one week. Ater one week Plavix only daily.  *If you need a refill on your cardiac medications before your next appointment, please call your pharmacy*   Lab Work: Your physician recommends that you have labs done in the office today. Your test included  basic metabolic panel, complete blood count, TSH, vitamin D, A1C, liver function and lipids.  If you have labs (blood work) drawn today and your tests are completely normal, you will receive your results only by: MyChart Message (if you have MyChart) OR A paper copy in the mail If you have any lab test that is abnormal or we need to change your treatment, we will call you to review the results.   Testing/Procedures: None ordered   Follow-Up: At [facility], you and your health needs are our priority.  As part of our continuing mission to provide you with exceptional heart care, we have created designated Provider Care Teams.  These Care Teams include your primary Cardiologist (physician) and Advanced Practice Providers (APPs -  Physician Assistants and Nurse Practitioners) who all work together to provide you with the care you need, when you need it.  We recommend signing up for the patient portal called "MyChart".  Sign up information is provided on this After Visit Summary.  MyChart is used to connect with patients for Virtual Visits (Telemedicine).  Patients are able to view lab/test results, encounter notes, upcoming appointments, etc.  Non-urgent messages can be sent to your provider as well.   To learn more about what you can do with MyChart, go to [email or web address].    Your next appointment:   9 month(s)  The format for your next appointment:   In Person  Provider:   Belva Crome, MD   Other  Instructions Clopidogrel Tablets What is this medication? CLOPIDOGREL (kloh PID oh grel) lowers the risk of heart attack, stroke, or blood clots. It prevents blood cells (platelets) from clumping together to form a clot. It belongs to a group of medications called antiplatelets. This medicine may be used for other purposes; ask your health care provider or pharmacist if you have questions. COMMON BRAND NAME(S): Plavix What should I tell my care team before I take this medication? They need to know if you have any of the following conditions: Bleeding disorders Bleeding in the brain Having surgery History of stomach bleeding An unusual or allergic reaction to clopidogrel, other medications, foods, dyes, or preservatives Pregnant or trying to get pregnant Breast-feeding How should I use this medication? Take this medication by mouth with a glass of water. Follow the directions on the prescription label. You may take this medication with or without food. If it upsets your stomach, take it with food. Take your medication at regular intervals. Do not take it more often than directed. Do not stop taking except on your care team's advice. A special MedGuide will be given to you by the pharmacist with each prescription and refill. Be sure to read this information carefully each time. Talk to your care team about the use of this medication in children. Special care may be needed. Overdosage: If you think you have taken too much of this medicine contact a poison control center or emergency room at once. NOTE: This medicine is only for you. Do not share this  medicine with others. What if I miss a dose? If you miss a dose, take it as soon as you can. If it is almost time for your next dose, take only that dose. Do not take double or extra doses. What may interact with this medication? Do not take this medication with the following: Dasabuvir; ombitasvir; paritaprevir;  ritonavir Defibrotide Selexipag This medication may also interact with the following: Certain medications that treat or prevent blood clots like warfarin Narcotic medications for pain NSAIDs, medications for pain and inflammation, like ibuprofen or naproxen Repaglinide SNRIs, medications for depression, like desvenlafaxine, duloxetine, levomilnacipran, venlafaxine SSRIs, medications for depression, like citalopram, escitalopram, fluoxetine, fluvoxamine, paroxetine, sertraline Stomach acid blockers like cimetidine, esomeprazole, omeprazole This list may not describe all possible interactions. Give your health care provider a list of all the medicines, herbs, non-prescription drugs, or dietary supplements you use. Also tell them if you smoke, drink alcohol, or use illegal drugs. Some items may interact with your medicine. What should I watch for while using this medication? Visit your care team for regular check-ups. Do not stop taking your medication unless your care team tells you to. Notify your care team and seek emergency services if you develop sudden numbness or weakness of the face, arm, or leg, trouble speaking, confusion, trouble walking, loss of balance or coordination, dizziness, severe headache, or change in vision. These can be signs that your condition has gotten worse. If you are going to have surgery or dental work, tell your care team that you are taking this medication. Certain genetic factors may reduce the effect of this medication. Your care team may use genetic tests to determine treatment. Only take aspirin if you are instructed to. Low doses of aspirin are used with this medication to treat some conditions. Taking aspirin with this medication can increase your risk of bleeding, so you must be careful. Talk to your care team if you have questions. What side effects may I notice from receiving this medication? Side effects that you should report to your care team as soon as  possible: Allergic reactions--skin rash, itching, hives, swelling of the face, lips, tongue, or throat Bleeding--bloody or black, tar-like stools, red or dark brown urine, vomiting blood or brown material that looks like coffee grounds, small, red or purple spots on the skin, unusual bleeding or bruising TTP--purple spots on the skin or inside the mouth, pale skin, yellowing skin or eyes, unusual weakness or fatigue, fever, fast or irregular heartbeat, confusion, change in vision, trouble speaking, trouble walking Side effects that usually do not require medical attention (report to your care team if they continue or are bothersome): Diarrhea Headache This list may not describe all possible side effects. Call your doctor for medical advice about side effects. You may report side effects to FDA at 1-800-FDA-1088. Where should I keep my medication? Keep out of the reach of children and pets. Store at room temperature of 59 to 86 degrees F (15 to 30 degrees C). Throw away any unused medication after the expiration date. NOTE: This sheet is a summary. It may not cover all possible information. If you have questions about this medicine, talk to your doctor, pharmacist, or health care provider.  2023 Elsevier/Gold Standard (2020-06-16 00:00:00)

## 2022-03-03 LAB — CBC
Hematocrit: 39.1 % (ref 34.0–46.6)
Hemoglobin: 13.2 g/dL (ref 11.1–15.9)
MCH: 33.3 pg — ABNORMAL HIGH (ref 26.6–33.0)
MCHC: 33.8 g/dL (ref 31.5–35.7)
MCV: 99 fL — ABNORMAL HIGH (ref 79–97)
Platelets: 212 10*3/uL (ref 150–450)
RBC: 3.96 x10E6/uL (ref 3.77–5.28)
RDW: 11.6 % — ABNORMAL LOW (ref 11.7–15.4)
WBC: 8.4 10*3/uL (ref 3.4–10.8)

## 2022-03-03 LAB — BASIC METABOLIC PANEL
BUN/Creatinine Ratio: 10 (ref 9–23)
BUN: 7 mg/dL (ref 6–24)
CO2: 19 mmol/L — ABNORMAL LOW (ref 20–29)
Calcium: 9.8 mg/dL (ref 8.7–10.2)
Chloride: 104 mmol/L (ref 96–106)
Creatinine, Ser: 0.71 mg/dL (ref 0.57–1.00)
Glucose: 89 mg/dL (ref 70–99)
Potassium: 4.3 mmol/L (ref 3.5–5.2)
Sodium: 138 mmol/L (ref 134–144)
eGFR: 98 mL/min/{1.73_m2} (ref >59)

## 2022-03-03 LAB — HEPATIC FUNCTION PANEL
ALT: 20 IU/L (ref 0–32)
AST: 15 IU/L (ref 0–40)
Albumin: 4.7 g/dL (ref 3.8–4.9)
Alkaline Phosphatase: 136 IU/L — ABNORMAL HIGH (ref 44–121)
Bilirubin Total: 0.5 mg/dL (ref 0.0–1.2)
Bilirubin, Direct: 0.14 mg/dL (ref 0.00–0.40)
Total Protein: 7.3 g/dL (ref 6.0–8.5)

## 2022-03-03 LAB — LIPID PANEL
Chol/HDL Ratio: 3.8 ratio (ref 0.0–4.4)
Cholesterol, Total: 125 mg/dL (ref 100–199)
HDL: 33 mg/dL — ABNORMAL LOW (ref >39)
LDL Chol Calc (NIH): 66 mg/dL (ref 0–99)
Triglycerides: 150 mg/dL — ABNORMAL HIGH (ref 0–149)
VLDL Cholesterol Cal: 26 mg/dL (ref 5–40)

## 2022-03-03 LAB — VITAMIN D 25 HYDROXY (VIT D DEFICIENCY, FRACTURES): Vit D, 25-Hydroxy: 28 ng/mL — ABNORMAL LOW (ref 30.0–100.0)

## 2022-03-03 LAB — TSH: TSH: 1.24 u[IU]/mL (ref 0.450–4.500)

## 2022-03-03 LAB — HEMOGLOBIN A1C
Est. average glucose Bld gHb Est-mCnc: 120 mg/dL
Hgb A1c MFr Bld: 5.8 % — ABNORMAL HIGH (ref 4.8–5.6)

## 2022-03-25 ENCOUNTER — Telehealth: Payer: Self-pay

## 2022-03-25 NOTE — Telephone Encounter (Signed)
LVM asking if patient has received mammogram, if so where but if not is patient interested in getting scheduled.

## 2022-04-01 ENCOUNTER — Ambulatory Visit: Payer: 59 | Admitting: Nurse Practitioner

## 2022-04-09 ENCOUNTER — Other Ambulatory Visit: Payer: Self-pay | Admitting: Cardiology

## 2022-04-13 ENCOUNTER — Other Ambulatory Visit: Payer: Self-pay

## 2022-04-13 MED ORDER — METOPROLOL TARTRATE 25 MG PO TABS: 12.5000 mg | ORAL_TABLET | Freq: Two times a day (BID) | ORAL | 1 refills | Status: DC

## 2022-06-09 DIAGNOSIS — H2513 Age-related nuclear cataract, bilateral: Secondary | ICD-10-CM | POA: Insufficient documentation

## 2022-06-09 DIAGNOSIS — H1820 Unspecified corneal edema: Secondary | ICD-10-CM | POA: Insufficient documentation

## 2022-06-09 DIAGNOSIS — H40039 Anatomical narrow angle, unspecified eye: Secondary | ICD-10-CM

## 2022-06-09 HISTORY — DX: Age-related nuclear cataract, bilateral: H25.13

## 2022-06-09 HISTORY — DX: Anatomical narrow angle, unspecified eye: H40.039

## 2022-06-09 HISTORY — DX: Unspecified corneal edema: H18.20

## 2022-08-12 ENCOUNTER — Telehealth: Payer: Self-pay | Admitting: *Deleted

## 2022-08-12 ENCOUNTER — Telehealth: Payer: Self-pay | Admitting: Cardiology

## 2022-08-12 NOTE — Telephone Encounter (Signed)
   Name: Penny Lopez  DOB: Oct 24, 1960  MRN: [id number]  Primary Cardiologist: Donato Heinz, MD   Preoperative team, please contact this patient and set up a phone call appointment for further preoperative risk assessment. Please obtain consent and complete medication review. Thank you for your help.  I confirm that guidance regarding antiplatelet and oral anticoagulation therapy has been completed and, if necessary, noted below.  May hold ASA 5-7 days prior to eye surgery.   Ledora Bottcher, PA 08/12/2022, 1:48 PM [facility]

## 2022-08-12 NOTE — Telephone Encounter (Addendum)
Pt has been scheduled for tele pre op appt 08/16/22 @ 1:40. Med rec and consent are done. Pt add on due to procedure date.     Our office just received this clearance request today for procedure 08/17/22.

## 2022-08-12 NOTE — Telephone Encounter (Signed)
   Pre-operative Risk Assessment    Patient Name: Penny Lopez  DOB: 1961/03/02 MRN: [id number]      Request for Surgical Clearance    Procedure:   DMEK  Date of Surgery:  Clearance 07/29/22 (08-17-22)                                Surgeon:  Dr. Cristela Blue Surgeon's Group or Practice Name:  [facility] Phone number:  [phone]  Fax number:  [phone]   Type of Clearance Requested:   - Medical  - Pharmacy:  Hold Aspirin and Plavix for 5 days   Type of Anesthesia:   GMO   Additional requests/questions:  Please fax a copy of medical clearance to the surgeon's office.  [name]   08/12/2022, 12:14 PM

## 2022-08-12 NOTE — Telephone Encounter (Signed)
1st attempt to reach pt regarding surgical clearance and the need for a tele visit.  Left a message for pt to call back and ask for the preop team. 

## 2022-08-12 NOTE — Telephone Encounter (Signed)
Pt has been scheduled for tele pre op appt 08/16/22 @ 1:40. Med rec and consent are done. Pt add on due to procedure date.     Patient Consent for Virtual Visit        TIWATOPE DENNY has provided verbal consent on 08/12/2022 for a virtual visit (video or telephone).   CONSENT FOR VIRTUAL VISIT FOR:  Zachery Dakins  By participating in this virtual visit I agree to the following:  I hereby voluntarily request, consent and authorize [facility] and its employed or contracted physicians, physician assistants, nurse practitioners or other licensed health care professionals (the Practitioner), to provide me with telemedicine health care services (the "Services") as deemed necessary by the treating Practitioner. I acknowledge and consent to receive the Services by the Practitioner via telemedicine. I understand that the telemedicine visit will involve communicating with the Practitioner through live audiovisual communication technology and the disclosure of certain medical information by electronic transmission. I acknowledge that I have been given the opportunity to request an in-person assessment or other available alternative prior to the telemedicine visit and am voluntarily participating in the telemedicine visit.  I understand that I have the right to withhold or withdraw my consent to the use of telemedicine in the course of my care at any time, without affecting my right to future care or treatment, and that the Practitioner or I may terminate the telemedicine visit at any time. I understand that I have the right to inspect all information obtained and/or recorded in the course of the telemedicine visit and may receive copies of available information for a reasonable fee.  I understand that some of the potential risks of receiving the Services via telemedicine include:  Delay or interruption in medical evaluation due to technological equipment failure or disruption; Information  transmitted may not be sufficient (e.g. poor resolution of images) to allow for appropriate medical decision making by the Practitioner; and/or  In rare instances, security protocols could fail, causing a breach of personal health information.  Furthermore, I acknowledge that it is my responsibility to provide information about my medical history, conditions and care that is complete and accurate to the best of my ability. I acknowledge that Practitioner's advice, recommendations, and/or decision may be based on factors not within their control, such as incomplete or inaccurate data provided by me or distortions of diagnostic images or specimens that may result from electronic transmissions. I understand that the practice of medicine is not an exact science and that Practitioner makes no warranties or guarantees regarding treatment outcomes. I acknowledge that a copy of this consent can be made available to me via my patient portal ([facility]), or I can request a printed copy by calling the office of [facility].    I understand that my insurance will be billed for this visit.   I have read or had this consent read to me. I understand the contents of this consent, which adequately explains the benefits and risks of the Services being provided via telemedicine.  I have been provided ample opportunity to ask questions regarding this consent and the Services and have had my questions answered to my satisfaction. I give my informed consent for the services to be provided through the use of telemedicine in my medical care

## 2022-08-15 NOTE — Progress Notes (Signed)
Virtual Visit via Telephone Note   Because of Penny Lopez co-morbid illnesses, she is at least at moderate risk for complications without adequate follow up.  This format is felt to be most appropriate for this patient at this time.  The patient did not have access to video technology/had technical difficulties with video requiring transitioning to audio format only (telephone).  All issues noted in this document were discussed and addressed.  No physical exam could be performed with this format.  Please refer to the patient's chart for her consent to telehealth for [facility].  Evaluation Performed:  Preoperative cardiovascular risk assessment _____________   Date:  08/15/2022   Patient ID:  Penny Lopez, DOB [birth year], MRN [id number] Patient Location:  Home Provider location:   Office  Primary Care Provider:  Ailene Ards, NP Primary Cardiologist:  Donato Heinz, MD  Chief Complaint / Patient Profile   62 y.o. y/o female with a h/o CAD s/p PCI/DES to proximal LCx, mild to moderate nonobstructive disease involving LAD, mid LCx/OM 2, and mid RCA on cardiac catheterization 02/2021,  HLD, tobacco abuse, who is pending DMEK and presents today for telephonic preoperative cardiovascular risk assessment.  History of Present Illness    Penny Lopez is a 62 y.o. female who presents via audio/video conferencing for a telehealth visit today.  Pt was last seen in cardiology clinic on 03/02/22 by Dr. Geraldo Pitter.  At that time Penny Lopez was doing well.  The patient is now pending procedure as outlined above. Since her last visit, she *** denies chest pain, shortness of breath, lower extremity edema, fatigue, palpitations, melena, hematuria, hemoptysis, diaphoresis, weakness, presyncope, syncope, orthopnea, and PND.   Past Medical History    Past Medical History:  Diagnosis Date   Acid reflux    CAD (coronary artery disease) 03/18/2021   Encounter for general  adult medical examination with abnormal findings 09/30/2021   Gastroesophageal reflux disease 05/30/2018   Prediabetes 09/30/2021   Unstable angina ([place]) 03/16/2021   Past Surgical History:  Procedure Laterality Date   CHOLECYSTECTOMY     CORONARY STENT INTERVENTION N/A 03/18/2021   Procedure: CORONARY STENT INTERVENTION;  Surgeon: Nelva Bush, MD;  Location: [facility];  Service: Cardiovascular;  Laterality: N/A;   LEFT HEART CATH AND CORONARY ANGIOGRAPHY N/A 03/18/2021   Procedure: LEFT HEART CATH AND CORONARY ANGIOGRAPHY;  Surgeon: Nelva Bush, MD;  Location: [facility];  Service: Cardiovascular;  Laterality: N/A;    Allergies  No Known Allergies  Home Medications    Prior to Admission medications   Medication Sig Start Date End Date Taking? Authorizing Provider  acetaminophen (TYLENOL) 650 MG CR tablet Take 650 mg by mouth every 8 (eight) hours as needed for pain.    [provider]  aspirin EC 81 MG tablet Take 1 tablet (81 mg total) by mouth daily. Swallow whole. 03/16/21   Revankar, Reita Cliche, MD  atorvastatin (LIPITOR) 10 MG tablet Take 1 tablet by mouth once daily 04/11/22   Revankar, Reita Cliche, MD  cholecalciferol (VITAMIN D) 25 MCG (1000 UNIT) tablet Take 1,000 Units by mouth every evening.    [provider]  clopidogrel (PLAVIX) 75 MG tablet Take 1 tablet (75 mg total) by mouth daily. 03/02/22   Revankar, Reita Cliche, MD  esomeprazole (NEXIUM) 20 MG capsule Take 20 mg by mouth every evening.    [provider]  metoprolol tartrate (LOPRESSOR) 25 MG tablet Take 0.5 tablets (  12.5 mg total) by mouth 2 (two) times daily. 04/13/22   Revankar, Reita Cliche, MD  nitroGLYCERIN (NITROSTAT) 0.4 MG SL tablet Place 0.4 mg under the tongue every 5 (five) minutes as needed for chest pain. Patient not taking: Reported on 08/12/2022    [provider]    Physical Exam    Vital Signs:  Penny Lopez does not have vital signs available for review  today.***  Given telephonic nature of communication, physical exam is limited. AAOx3. NAD. Normal affect.  Speech and respirations are unlabored.  Accessory Clinical Findings    None  Assessment & Plan    1.  Preoperative Cardiovascular Risk Assessment:  The patient was advised that if she develops new symptoms prior to surgery to contact our office to arrange for a follow-up visit, and she verbalized understanding.  Per office protocol, he may hold Plavix for 5 days days prior to procedure and should resume as soon as hemodynamically stable postoperatively. Ideally aspirin should be continued without interruption, however if the bleeding risk is too great, aspirin may be held for 7 days prior to surgery. Please resume aspirin post operatively when it is felt to be safe from a bleeding standpoint.    A copy of this note will be routed to requesting surgeon.  Time:   Today, I have spent *** minutes with the patient with telehealth technology discussing medical history, symptoms, and management plan.     Emmaline Life, NP  08/15/2022, 7:35 AM

## 2022-08-16 ENCOUNTER — Ambulatory Visit: Payer: 59 | Attending: Cardiovascular Disease | Admitting: Nurse Practitioner

## 2022-08-16 ENCOUNTER — Encounter: Payer: Self-pay | Admitting: Nurse Practitioner

## 2022-08-16 DIAGNOSIS — Z01810 Encounter for preprocedural cardiovascular examination: Secondary | ICD-10-CM

## 2022-12-19 ENCOUNTER — Other Ambulatory Visit: Payer: Self-pay | Admitting: Cardiology

## 2022-12-19 NOTE — Telephone Encounter (Signed)
RX sent

## 2023-02-03 ENCOUNTER — Other Ambulatory Visit: Payer: Self-pay | Admitting: Cardiology

## 2023-02-03 DIAGNOSIS — I2510 Atherosclerotic heart disease of native coronary artery without angina pectoris: Secondary | ICD-10-CM

## 2023-02-05 IMAGING — CT CT L SPINE W/O CM
1 of 6 series · 7 of 14 positions shown, 9 images · non-contrast
Comparison: MRI lumbar spine 01/02/2021

CLINICAL DATA: Degenerative disc disease MRI sided pain

EXAM:
CT LUMBAR SPINE WITHOUT CONTRAST
TECHNIQUE: Multidetector CT imaging of the lumbar spine was performed without
intravenous contrast administration. Multiplanar CT image
reconstructions were also generated.

[Series 3: l-spine 2.0 st · axial · 0.30mm/px · z∈[-148,+56]mm · 7 of 138 slices shown, 9 images]
[im 18/138  soft-tissue]
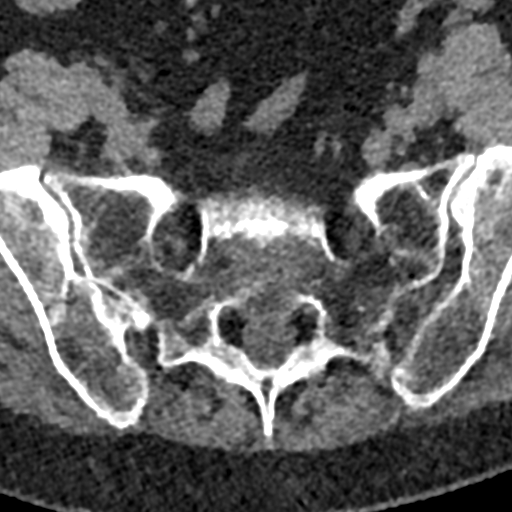
[im 18/138  bone]
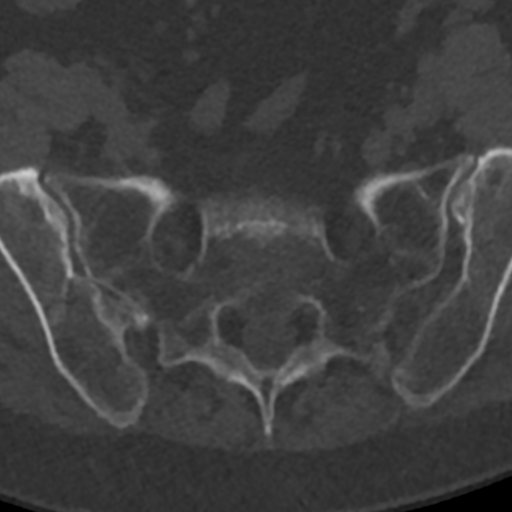
[im 35/138  bone]
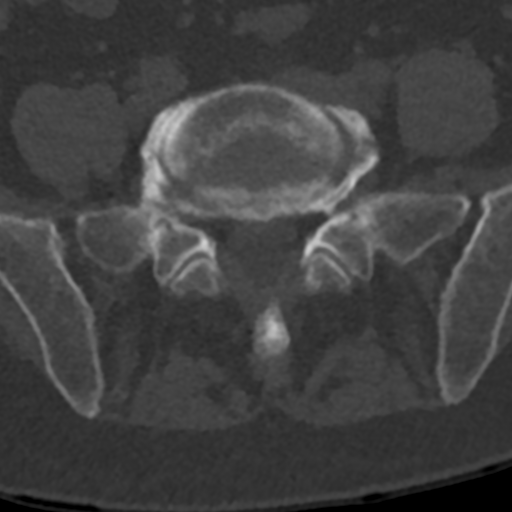
[im 52/138  bone]
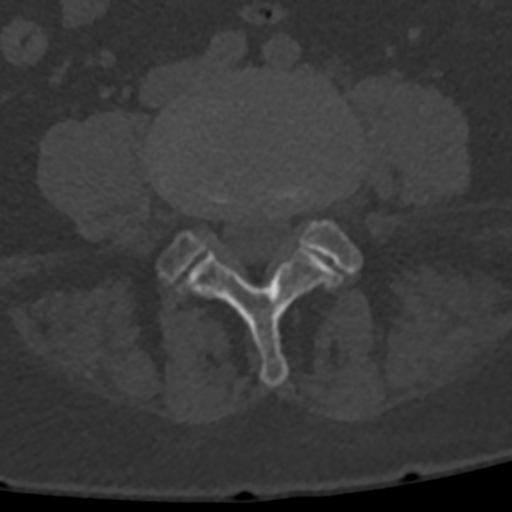
[im 69/138  bone]
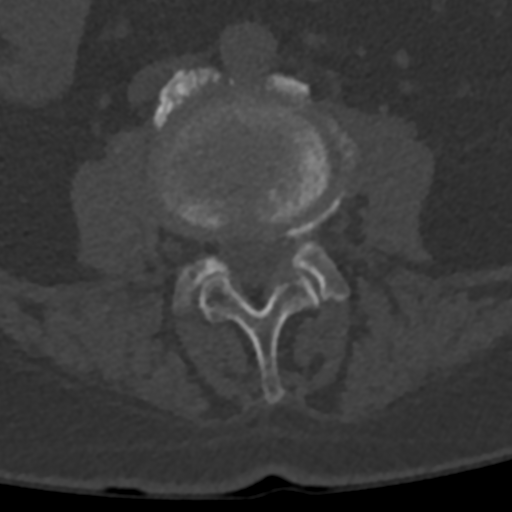
[im 86/138  soft-tissue]
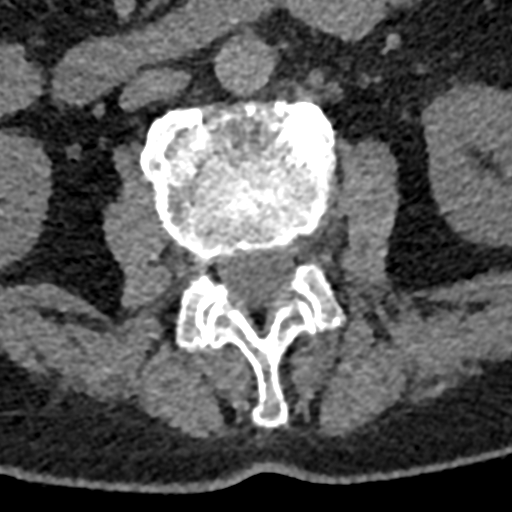
[im 86/138  bone]
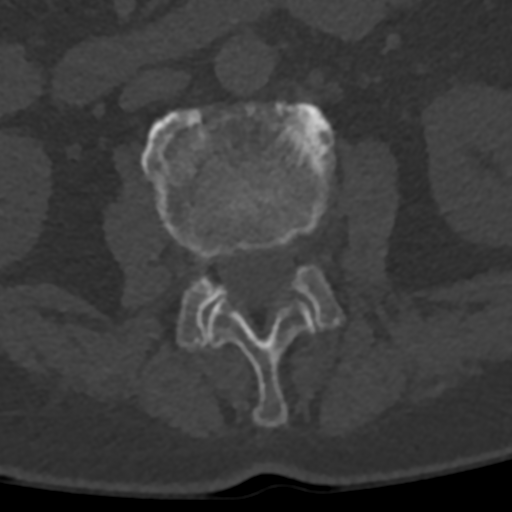
[im 103/138  bone]
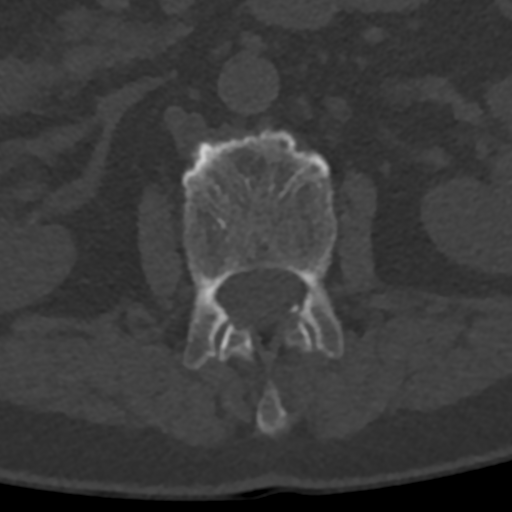
[im 120/138  bone]
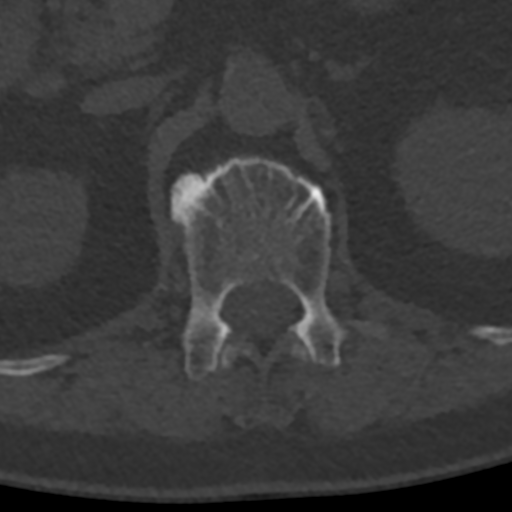

[7 of 14 positions shown; findings below may reference images not displayed]

FINDINGS: Segmentation: 5 lumbar type vertebrae.

Alignment: Physiologic

Vertebrae: No evidence of lumbar spine fracture. No suspicious lytic
or blastic lesion.

Paraspinal and other soft tissues: Negative

Disc levels:

T12-L1: No significant spinal canal or neural foraminal narrowing.

L1-L2: No significant spinal canal or neural foraminal narrowing.

L2-L3: Small right paracentral disc protrusion. No significant
spinal canal or neural foraminal stenosis.

L3-L4: Mild asymmetric left disc bulging and endplate spurring. Mild
bilateral facet arthropathy. No significant spinal canal stenosis or
neural foraminal narrowing.

L4-L5: Mild disc bulging and left-sided endplate spurring. Bilateral
facet arthropathy. No significant spinal canal stenosis or neural
foraminal narrowing.

L5-S1: Broad-based posterior disc osteophyte complex and bilateral
facet arthropathy. No significant spinal canal stenosis. No
significant neural foraminal narrowing.
IMPRESSION: Multilevel degenerative changes of the lumbar spine, overall similar
to recent MRI in [REDACTED]. No visible impingement on CT.

No acute osseous abnormality.

## 2023-03-13 ENCOUNTER — Telehealth: Payer: Self-pay | Admitting: Cardiology

## 2023-03-13 DIAGNOSIS — I2510 Atherosclerotic heart disease of native coronary artery without angina pectoris: Secondary | ICD-10-CM

## 2023-03-13 MED ORDER — METOPROLOL TARTRATE 25 MG PO TABS: 12.5000 mg | ORAL_TABLET | Freq: Two times a day (BID) | ORAL | 1 refills | Status: DC

## 2023-03-13 MED ORDER — CLOPIDOGREL BISULFATE 75 MG PO TABS
75.0000 mg | ORAL_TABLET | Freq: Every day | ORAL | 0 refills | Status: DC
Start: 2023-03-13 — End: 2023-08-21

## 2023-03-13 NOTE — Telephone Encounter (Signed)
*  STAT* If patient is at the pharmacy, call can be transferred to refill team.   1. Which medications need to be refilled? (please list name of each medication and dose if known) metoprolol tartrate (LOPRESSOR) 25 MG tablet clopidogrel (PLAVIX) 75 MG tablet  2. Which pharmacy/location (including street and city if local pharmacy) is medication to be sent to? [id number] - [place], [place] - [address]  3. Do they need a 30 day or 90 day supply?  90 day supply

## 2023-04-26 ENCOUNTER — Encounter: Payer: Self-pay | Admitting: Cardiology

## 2023-04-26 ENCOUNTER — Ambulatory Visit: Payer: 59 | Attending: Cardiology | Admitting: Cardiology

## 2023-04-26 VITALS — BP 124/82 | HR 64 | Ht 71.0 in | Wt 204.6 lb

## 2023-04-26 DIAGNOSIS — E782 Mixed hyperlipidemia: Secondary | ICD-10-CM | POA: Diagnosis not present

## 2023-04-26 DIAGNOSIS — E559 Vitamin D deficiency, unspecified: Secondary | ICD-10-CM

## 2023-04-26 DIAGNOSIS — I2510 Atherosclerotic heart disease of native coronary artery without angina pectoris: Secondary | ICD-10-CM | POA: Diagnosis not present

## 2023-04-26 DIAGNOSIS — F17210 Nicotine dependence, cigarettes, uncomplicated: Secondary | ICD-10-CM

## 2023-04-26 NOTE — Progress Notes (Signed)
Cardiology Office Note:    Date:  04/26/2023   ID:  Penny Lopez, DOB October 12, 1960, MRN [id number]  PCP:  Elenore Paddy, NP  Cardiologist:  Garwin Brothers, MD   Referring MD: Elenore Paddy, NP     ASSESSMENT:    1. Mixed dyslipidemia   2. Coronary artery disease involving native coronary artery of native heart without angina pectoris   3. Vitamin D deficiency   4. Cigarette smoker    PLAN:    In order of problems listed above:  Coronary artery disease: Secondary prevention stressed to the patient.  Importance of compliance with diet medication stressed and she vocalized understanding.  She was advised to ambulate to the best of her ability. Mixed dyslipidemia: On lipid-lowering medications.  She has not taken medications for 3 weeks to statins.  She is fasting and will get complete blood work today.  Will advise her accordingly.  Caution was advised about compliance with diet and medications. Former smoker: She has quit smoking and I congratulated her about this. She has history of vitamin D deficiency and we will check a level today. Patient will be seen in follow-up appointment in 6 months or earlier if the patient has any concerns.    Medication Adjustments/Labs and Tests Ordered: Current medicines are reviewed at length with the patient today.  Concerns regarding medicines are outlined above.  Orders Placed This Encounter  Procedures   CBC   Comprehensive metabolic panel   Lipid panel   TSH   VITAMIN D 25 Hydroxy (Vit-D Deficiency, Fractures)   EKG 12-Lead   No orders of the defined types were placed in this encounter.    No chief complaint on file.    History of Present Illness:    Penny Lopez is a 62 y.o. female.  Patient has past medical history of coronary artery disease mixed dyslipidemia and used to be a smoker but quit several months ago.  She denies any chest pain orthopnea or PND.  She leads a sedentary lifestyle.  She has injured her left knee  and is in a brace.  Therefore she does not ambulate much and leads a sedentary lifestyle.  She does not exercise.  At the time of my evaluation, the patient is alert awake oriented and in no distress.  Past Medical History:  Diagnosis Date   Acid reflux    Age-related nuclear cataract of both eyes 06/09/2022   Anatomical narrow angle 06/09/2022   CAD (coronary artery disease) 03/18/2021   Cigarette smoker 03/16/2021   Corneal edema of right eye 06/09/2022   Encounter for general adult medical examination with abnormal findings 09/30/2021   Gastroesophageal reflux disease 05/30/2018   Mixed dyslipidemia 03/02/2022   Prediabetes 09/30/2021   Unstable angina (HCC) 03/16/2021    Past Surgical History:  Procedure Laterality Date   CHOLECYSTECTOMY     CORONARY STENT INTERVENTION N/A 03/18/2021   Procedure: CORONARY STENT INTERVENTION;  Surgeon: Yvonne Kendall, MD;  Location: [facility];  Service: Cardiovascular;  Laterality: N/A;   LEFT HEART CATH AND CORONARY ANGIOGRAPHY N/A 03/18/2021   Procedure: LEFT HEART CATH AND CORONARY ANGIOGRAPHY;  Surgeon: Yvonne Kendall, MD;  Location: [facility];  Service: Cardiovascular;  Laterality: N/A;    Current Medications: Current Meds  Medication Sig   acetaminophen (TYLENOL) 650 MG CR tablet Take 650 mg by mouth every 8 (eight) hours as needed for pain.   aspirin EC 81 MG tablet Take 1 tablet (81  mg total) by mouth daily. Swallow whole.   atorvastatin (LIPITOR) 10 MG tablet Take 1 tablet by mouth once daily   cholecalciferol (VITAMIN D) 25 MCG (1000 UNIT) tablet Take 1,000 Units by mouth every evening.   clopidogrel (PLAVIX) 75 MG tablet Take 1 tablet (75 mg total) by mouth daily.   esomeprazole (NEXIUM) 20 MG capsule Take 20 mg by mouth every evening.   metoprolol tartrate (LOPRESSOR) 25 MG tablet Take 0.5 tablets (12.5 mg total) by mouth 2 (two) times daily.   nitroGLYCERIN (NITROSTAT) 0.4 MG SL tablet Place 0.4 mg under the  tongue every 5 (five) minutes as needed for chest pain.     Allergies:   Patient has no known allergies.   Social History   Socioeconomic History   Marital status: Married    Spouse name: Not on file   Number of children: Not on file   Years of education: Not on file   Highest education level: Not on file  Occupational History   Not on file  Tobacco Use   Smoking status: Former    Current packs/day: 0.00    Types: Cigarettes    Quit date: 03/16/2021    Years since quitting: 2.1   Smokeless tobacco: Never  Substance and Sexual Activity   Alcohol use: No   Drug use: No   Sexual activity: Not on file  Other Topics Concern   Not on file  Social History Narrative   Not on file   Social Determinants of Health   Financial Resource Strain: Not on file  Food Insecurity: Not on file  Transportation Needs: Not on file  Physical Activity: Not on file  Stress: Not on file  Social Connections: Not on file     Family History: The patient's family history includes Atrial fibrillation in her mother; Heart failure in her father. There is no history of Hypertension, Diabetes, Cancer, or Heart disease.  ROS:   Please see the history of present illness.    All other systems reviewed and are negative.  EKGs/Labs/Other Studies Reviewed:    The following studies were reviewed today: .Marland KitchenEKG Interpretation Date/Time:  Wednesday April 26 2023 11:04:33 EDT Ventricular Rate:  64 PR Interval:  140 QRS Duration:  76 QT Interval:  378 QTC Calculation: 389 R Axis:   4  Text Interpretation: Sinus rhythm with occasional Premature ventricular complexes and Premature atrial complexes Possible Anterior infarct , age undetermined Abnormal ECG When compared with ECG of 18-Mar-2021 08:49, Premature ventricular complexes are now Present Premature atrial complexes are now Present Confirmed by Belva Crome [phone]) on 04/26/2023 11:13:59 AM     Recent Labs: No results found for requested labs  within last 365 days.  Recent Lipid Panel    Component Value Date/Time   CHOL 125 03/02/2022 1448   TRIG 150 (H) 03/02/2022 1448   HDL 33 (L) 03/02/2022 1448   CHOLHDL 3.8 03/02/2022 1448   CHOLHDL 3 08/02/2021 0948   VLDL 19.0 08/02/2021 0948   LDLCALC 66 03/02/2022 1448    Physical Exam:    VS:  BP 124/82   Pulse 64   Ht 5\' 11"  (1.803 m)   Wt 204 lb 9.6 oz (92.8 kg)   SpO2 98%   BMI 28.54 kg/m     Wt Readings from Last 3 Encounters:  04/26/23 204 lb 9.6 oz (92.8 kg)  03/02/22 207 lb 1.3 oz (93.9 kg)  09/30/21 214 lb 6 oz (97.2 kg)     GEN: Patient is  in no acute distress HEENT: Normal NECK: No JVD; No carotid bruits LYMPHATICS: No lymphadenopathy CARDIAC: Hear sounds regular, 2/6 systolic murmur at the apex. RESPIRATORY:  Clear to auscultation without rales, wheezing or rhonchi  ABDOMEN: Soft, non-tender, non-distended MUSCULOSKELETAL:  No edema; No deformity  SKIN: Warm and dry NEUROLOGIC:  Alert and oriented x 3 PSYCHIATRIC:  Normal affect   Signed, Garwin Brothers, MD  04/26/2023 11:23 AM    [facility]

## 2023-04-26 NOTE — Patient Instructions (Signed)
Medication Instructions:  Your physician recommends that you continue on your current medications as directed. Please refer to the Current Medication list given to you today.  *If you need a refill on your cardiac medications before your next appointment, please call your pharmacy*   Lab Work: Your physician recommends that you have a CMP, CBC, TSH, vitamin D and lipids today in the office.  If you have labs (blood work) drawn today and your tests are completely normal, you will receive your results only by: MyChart Message (if you have MyChart) OR A paper copy in the mail If you have any lab test that is abnormal or we need to change your treatment, we will call you to review the results.   Testing/Procedures: None ordered   Follow-Up: At [facility], you and your health needs are our priority.  As part of our continuing mission to provide you with exceptional heart care, we have created designated Provider Care Teams.  These Care Teams include your primary Cardiologist (physician) and Advanced Practice Providers (APPs -  Physician Assistants and Nurse Practitioners) who all work together to provide you with the care you need, when you need it.  We recommend signing up for the patient portal called "MyChart".  Sign up information is provided on this After Visit Summary.  MyChart is used to connect with patients for Virtual Visits (Telemedicine).  Patients are able to view lab/test results, encounter notes, upcoming appointments, etc.  Non-urgent messages can be sent to your provider as well.   To learn more about what you can do with MyChart, go to [email or web address].    Your next appointment:   9 month(s)  The format for your next appointment:   In Person  Provider:   Belva Crome, MD    Other Instructions none  Important Information About Sugar

## 2023-04-27 LAB — CBC
Hematocrit: 40.7 % (ref 34.0–46.6)
Hemoglobin: 13.8 g/dL (ref 11.1–15.9)
MCH: 35.1 pg — ABNORMAL HIGH (ref 26.6–33.0)
MCHC: 33.9 g/dL (ref 31.5–35.7)
MCV: 104 fL — ABNORMAL HIGH (ref 79–97)
Platelets: 221 10*3/uL (ref 150–450)
RBC: 3.93 x10E6/uL (ref 3.77–5.28)
RDW: 11.4 % — ABNORMAL LOW (ref 11.7–15.4)
WBC: 6.7 10*3/uL (ref 3.4–10.8)

## 2023-04-27 LAB — COMPREHENSIVE METABOLIC PANEL
ALT: 12 [IU]/L (ref 0–32)
AST: 14 [IU]/L (ref 0–40)
Albumin: 4.7 g/dL (ref 3.8–4.9)
Alkaline Phosphatase: 137 [IU]/L — ABNORMAL HIGH (ref 44–121)
BUN/Creatinine Ratio: 14 (ref 12–28)
BUN: 10 mg/dL (ref 8–27)
Bilirubin Total: 0.6 mg/dL (ref 0.0–1.2)
CO2: 21 mmol/L (ref 20–29)
Calcium: 9.6 mg/dL (ref 8.7–10.3)
Chloride: 103 mmol/L (ref 96–106)
Creatinine, Ser: 0.74 mg/dL (ref 0.57–1.00)
Globulin, Total: 2.3 g/dL (ref 1.5–4.5)
Glucose: 97 mg/dL (ref 70–99)
Potassium: 4.5 mmol/L (ref 3.5–5.2)
Sodium: 138 mmol/L (ref 134–144)
Total Protein: 7 g/dL (ref 6.0–8.5)
eGFR: 93 mL/min/{1.73_m2} (ref >59)

## 2023-04-27 LAB — TSH: TSH: 1.26 u[IU]/mL (ref 0.450–4.500)

## 2023-04-27 LAB — LIPID PANEL
Chol/HDL Ratio: 5.4 ratio — ABNORMAL HIGH (ref 0.0–4.4)
Cholesterol, Total: 199 mg/dL (ref 100–199)
HDL: 37 mg/dL — ABNORMAL LOW (ref >39)
LDL Chol Calc (NIH): 134 mg/dL — ABNORMAL HIGH (ref 0–99)
Triglycerides: 158 mg/dL — ABNORMAL HIGH (ref 0–149)
VLDL Cholesterol Cal: 28 mg/dL (ref 5–40)

## 2023-04-27 LAB — VITAMIN D 25 HYDROXY (VIT D DEFICIENCY, FRACTURES): Vit D, 25-Hydroxy: 31.3 ng/mL (ref 30.0–100.0)

## 2023-05-01 ENCOUNTER — Telehealth: Payer: Self-pay

## 2023-05-01 DIAGNOSIS — E782 Mixed hyperlipidemia: Secondary | ICD-10-CM

## 2023-05-01 MED ORDER — ATORVASTATIN CALCIUM 40 MG PO TABS: ORAL_TABLET | ORAL | 3 refills | Status: DC

## 2023-05-01 NOTE — Telephone Encounter (Signed)
-----   Message from Laurel R Revankar sent at 04/27/2023  8:12 AM EDT ----- Atorvastatin 40 mg 1 daily 20 mg alternate day.  Liver lipid check in 6 weeks.  Diet and exercise.  Tell her to talk to primary care about abnormal CBC.  This is very important. Garwin Brothers, MD 04/27/2023 8:12 AM

## 2023-08-20 ENCOUNTER — Other Ambulatory Visit: Payer: Self-pay | Admitting: Cardiology

## 2023-08-20 DIAGNOSIS — I2510 Atherosclerotic heart disease of native coronary artery without angina pectoris: Secondary | ICD-10-CM

## 2023-12-18 ENCOUNTER — Other Ambulatory Visit: Payer: Self-pay | Admitting: Cardiology

## 2024-02-13 ENCOUNTER — Other Ambulatory Visit: Payer: Self-pay | Admitting: Cardiology

## 2024-02-13 DIAGNOSIS — I2510 Atherosclerotic heart disease of native coronary artery without angina pectoris: Secondary | ICD-10-CM

## 2024-02-18 ENCOUNTER — Other Ambulatory Visit: Payer: Self-pay | Admitting: Cardiology

## 2024-05-09 ENCOUNTER — Other Ambulatory Visit: Payer: Self-pay | Admitting: Cardiology

## 2024-05-09 DIAGNOSIS — I2510 Atherosclerotic heart disease of native coronary artery without angina pectoris: Secondary | ICD-10-CM

## 2024-05-10 MED ORDER — ATORVASTATIN CALCIUM 40 MG PO TABS: ORAL_TABLET | ORAL | 0 refills | Status: DC

## 2024-05-21 ENCOUNTER — Other Ambulatory Visit: Payer: Self-pay | Admitting: Cardiology

## 2024-05-21 DIAGNOSIS — I2510 Atherosclerotic heart disease of native coronary artery without angina pectoris: Secondary | ICD-10-CM

## 2024-07-11 ENCOUNTER — Encounter: Payer: Self-pay | Admitting: Cardiology

## 2024-07-11 ENCOUNTER — Ambulatory Visit: Attending: Cardiology | Admitting: Cardiology

## 2024-07-11 VITALS — BP 124/76 | HR 61 | Ht 71.0 in | Wt 196.6 lb

## 2024-07-11 DIAGNOSIS — Z1321 Encounter for screening for nutritional disorder: Secondary | ICD-10-CM

## 2024-07-11 DIAGNOSIS — F17210 Nicotine dependence, cigarettes, uncomplicated: Secondary | ICD-10-CM | POA: Diagnosis not present

## 2024-07-11 DIAGNOSIS — I2510 Atherosclerotic heart disease of native coronary artery without angina pectoris: Secondary | ICD-10-CM

## 2024-07-11 DIAGNOSIS — R7303 Prediabetes: Secondary | ICD-10-CM

## 2024-07-11 DIAGNOSIS — E782 Mixed hyperlipidemia: Secondary | ICD-10-CM | POA: Diagnosis not present

## 2024-07-11 MED ORDER — METOPROLOL TARTRATE 25 MG PO TABS: 12.5000 mg | ORAL_TABLET | Freq: Two times a day (BID) | ORAL | 3 refills | Status: AC

## 2024-07-11 MED ORDER — ATORVASTATIN CALCIUM 40 MG PO TABS: ORAL_TABLET | ORAL | 3 refills | Status: AC

## 2024-07-11 MED ORDER — CLOPIDOGREL BISULFATE 75 MG PO TABS: 75.0000 mg | ORAL_TABLET | Freq: Every day | ORAL | 3 refills | Status: AC

## 2024-07-11 NOTE — Patient Instructions (Signed)
 Medication Instructions:  Your physician has recommended you make the following change in your medication:   Stop Aspirin   *If you need a refill on your cardiac medications before your next appointment, please call your pharmacy*   Lab Work: Your physician recommends that you have a CBC, CMP, TSH, A1C, vitamin D  and lipids today in the office.  If you have labs (blood work) drawn today and your tests are completely normal, you will receive your results only by: MyChart Message (if you have MyChart) OR A paper copy in the mail If you have any lab test that is abnormal or we need to change your treatment, we will call you to review the results.   Testing/Procedures: None ordered   Follow-Up: At [facility], you and your health needs are our priority.  As part of our continuing mission to provide you with exceptional heart care, we have created designated Provider Care Teams.  These Care Teams include your primary Cardiologist (physician) and Advanced Practice Providers (APPs -  Physician Assistants and Nurse Practitioners) who all work together to provide you with the care you need, when you need it.  We recommend signing up for the patient portal called MyChart.  Sign up information is provided on this After Visit Summary.  MyChart is used to connect with patients for Virtual Visits (Telemedicine).  Patients are able to view lab/test results, encounter notes, upcoming appointments, etc.  Non-urgent messages can be sent to your provider as well.   To learn more about what you can do with MyChart, go to [email or web address].    Your next appointment:   9 month(s)  The format for your next appointment:   In Person  Provider:   Jennifer Crape, MD    Other Instructions none  Important Information About Sugar

## 2024-07-11 NOTE — Addendum Note (Signed)
 Addended by: ONEITA BERLINER on: 07/11/2024 10:04 AM   Modules accepted: Orders

## 2024-07-11 NOTE — Progress Notes (Signed)
 " Cardiology Office Note:    Date:  07/11/2024   ID:  Penny Lopez, DOB 12-03-1960, MRN [id number]  PCP:  Elnor Lauraine BRAVO, NP  Cardiologist:  Jennifer JONELLE Crape, MD   Referring MD: Elnor Lauraine BRAVO, NP    ASSESSMENT:    1. Mixed dyslipidemia   2. Coronary artery disease involving native coronary artery of native heart without angina pectoris   3. Cigarette smoker    PLAN:    In order of problems listed above:  Coronary artery disease: Secondary prevention stressed with the patient.  Importance of compliance with diet medication stressed and patient verbalized standing.  She was advised to walk at least half an hour a day on a daily basis and she promises to do so. Mixed dyslipidemia: On lipid-lowering medications followed by primary care.  She is fasting and will get blood work here today.  Goal LDL less than 60.  Diet emphasized. Cigarette smoker: I spent 5 minutes with the patient discussing solely about smoking. Smoking cessation was counseled. I suggested to the patient also different medications and pharmacological interventions. Patient is keen to try stopping on its own at this time. He will get back to me if he needs any further assistance in this matter.   History of vitamin D  deficiency: Primary care.  We will do blood work and add vitamin D  and A1c today. Patient will be seen in follow-up appointment in 6 months or earlier if the patient has any concerns.    Medication Adjustments/Labs and Tests Ordered: Current medicines are reviewed at length with the patient today.  Concerns regarding medicines are outlined above.  Orders Placed This Encounter  Procedures   EKG 12-Lead   No orders of the defined types were placed in this encounter.    No chief complaint on file.    History of Present Illness:    Penny Lopez is a 64 y.o. female.  Patient has past medical history of coronary artery disease post stenting, mixed dyslipidemia and unfortunately continues to smoke.   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  No chest pain orthopnea or PND.  At the time of my evaluation, the patient is alert awake oriented and in no distress.  Past Medical History:  Diagnosis Date   Acid reflux    Age-related nuclear cataract of both eyes 06/09/2022   Anatomical narrow angle 06/09/2022   CAD (coronary artery disease) 03/18/2021   Cigarette smoker 03/16/2021   Corneal edema of right eye 06/09/2022   Encounter for general adult medical examination with abnormal findings 09/30/2021   Gastroesophageal reflux disease 05/30/2018   Mixed dyslipidemia 03/02/2022   Prediabetes 09/30/2021   Unstable angina (HCC) 03/16/2021    Past Surgical History:  Procedure Laterality Date   CHOLECYSTECTOMY     CORONARY STENT INTERVENTION N/A 03/18/2021   Procedure: CORONARY STENT INTERVENTION;  Surgeon: Mady Bruckner, MD;  Location: [facility];  Service: Cardiovascular;  Laterality: N/A;   LEFT HEART CATH AND CORONARY ANGIOGRAPHY N/A 03/18/2021   Procedure: LEFT HEART CATH AND CORONARY ANGIOGRAPHY;  Surgeon: Mady Bruckner, MD;  Location: [facility];  Service: Cardiovascular;  Laterality: N/A;    Current Medications: Active Medications[1]   Allergies:   Patient has no known allergies.   Social History   Socioeconomic History   Marital status: Married    Spouse name: Not on file  Number of children: Not on file   Years of education: Not on file   Highest education level: Not on file  Occupational History   Not on file  Tobacco Use   Smoking status: Former    Current packs/day: 0.00    Types: Cigarettes    Quit date: 03/16/2021    Years since quitting: 3.3   Smokeless tobacco: Never  Substance and Sexual Activity   Alcohol use: No   Drug use: No   Sexual activity: Not on file  Other Topics Concern   Not on file  Social History Narrative   Not on file   Social Drivers of  Health   Tobacco Use: Medium Risk (07/11/2024)   Patient History    Smoking Tobacco Use: Former    Smokeless Tobacco Use: Never    Passive Exposure: Not on Actuary Strain: Not on file  Food Insecurity: Not on file  Transportation Needs: Not on file  Physical Activity: Not on file  Stress: Not on file  Social Connections: Not on file  Depression (PHQ2-9): Low Risk (09/30/2021)   Depression (PHQ2-9)    PHQ-2 Score: 0  Alcohol Screen: Not on file  Housing: Not on file  Utilities: Not on file  Health Literacy: Not on file     Family History: The patient's family history includes Atrial fibrillation in her mother; Heart failure in her father. There is no history of Hypertension, Diabetes, Cancer, or Heart disease.  ROS:   Please see the history of present illness.    All other systems reviewed and are negative.  EKGs/Labs/Other Studies Reviewed:    The following studies were reviewed today: .SABRAEKG Interpretation Date/Time:  Thursday July 11 2024 08:54:26 EST Ventricular Rate:  61 PR Interval:  130 QRS Duration:  82 QT Interval:  394 QTC Calculation: 396 R Axis:   8  Text Interpretation: Normal sinus rhythm Cannot rule out Anterior infarct (cited on or before 26-Apr-2023) Abnormal ECG When compared with ECG of 26-Apr-2023 11:04, Premature ventricular complexes are no longer Present Premature atrial complexes are no longer Present Confirmed by Edwyna Backers [phone]) on 07/11/2024 9:19:16 AM     Recent Labs: No results found for requested labs within last 365 days.  Recent Lipid Panel    Component Value Date/Time   CHOL 199 04/26/2023 1320   TRIG 158 (H) 04/26/2023 1320   HDL 37 (L) 04/26/2023 1320   CHOLHDL 5.4 (H) 04/26/2023 1320   CHOLHDL 3 08/02/2021 0948   VLDL 19.0 08/02/2021 0948   LDLCALC 134 (H) 04/26/2023 1320    Physical Exam:    VS:  BP 124/76   Pulse 61   Ht 5' 11 (1.803 m)   Wt 196 lb 10.1 oz (89.2 kg)   SpO2 97%   BMI 27.42  kg/m     Wt Readings from Last 3 Encounters:  07/11/24 196 lb 10.1 oz (89.2 kg)  04/26/23 204 lb 9.6 oz (92.8 kg)  03/02/22 207 lb 1.3 oz (93.9 kg)     GEN: Patient is in no acute distress HEENT: Normal NECK: No JVD; No carotid bruits LYMPHATICS: No lymphadenopathy CARDIAC: Hear sounds regular, 2/6 systolic murmur at the apex. RESPIRATORY:  Clear to auscultation without rales, wheezing or rhonchi  ABDOMEN: Soft, non-tender, non-distended MUSCULOSKELETAL:  No edema; No deformity  SKIN: Warm and dry NEUROLOGIC:  Alert and oriented x 3 PSYCHIATRIC:  Normal affect   Signed, Backers JONELLE Edwyna, MD  07/11/2024 9:41 AM  [facility]     [1]  Current Meds  Medication Sig   acetaminophen  (TYLENOL ) 650 MG CR tablet Take 650 mg by mouth every 8 (eight) hours as needed for pain.   aspirin  EC 81 MG tablet Take 1 tablet (81 mg total) by mouth daily. Swallow whole.   atorvastatin  (LIPITOR) 40 MG tablet TAKE 1 TABLET BY MOUTH ALTERNATING WITH 20 MG (ONE-HALF TABLET) EVERY OTHER DAY   cholecalciferol (VITAMIN D ) 25 MCG (1000 UNIT) tablet Take 1,000 Units by mouth every evening.   clopidogrel  (PLAVIX ) 75 MG tablet Take 1 tablet (75 mg total) by mouth daily. Patient needs an appointment for further refills. 2 nd attempt   esomeprazole (NEXIUM) 20 MG capsule Take 20 mg by mouth every evening.   metoprolol  tartrate (LOPRESSOR ) 25 MG tablet Take 1/2 (one-half) tablet by mouth twice daily   nitroGLYCERIN  (NITROSTAT ) 0.4 MG SL tablet Place 0.4 mg under the tongue every 5 (five) minutes as needed for chest pain.   "

## 2024-07-11 NOTE — Addendum Note (Signed)
 Addended by: ONEITA BERLINER on: 07/11/2024 09:57 AM   Modules accepted: Orders

## 2024-07-12 ENCOUNTER — Ambulatory Visit: Payer: Self-pay | Admitting: Cardiology

## 2024-07-12 LAB — COMPREHENSIVE METABOLIC PANEL WITH GFR
ALT: 14 IU/L (ref 0–32)
AST: 14 IU/L (ref 0–40)
Albumin: 4.8 g/dL (ref 3.9–4.9)
Alkaline Phosphatase: 141 IU/L — ABNORMAL HIGH (ref 49–135)
BUN/Creatinine Ratio: 14 (ref 12–28)
BUN: 12 mg/dL (ref 8–27)
Bilirubin Total: 1 mg/dL (ref 0.0–1.2)
CO2: 20 mmol/L (ref 20–29)
Calcium: 9.5 mg/dL (ref 8.7–10.3)
Chloride: 102 mmol/L (ref 96–106)
Creatinine, Ser: 0.83 mg/dL (ref 0.57–1.00)
Globulin, Total: 2.3 g/dL (ref 1.5–4.5)
Glucose: 100 mg/dL — ABNORMAL HIGH (ref 70–99)
Potassium: 4.3 mmol/L (ref 3.5–5.2)
Sodium: 137 mmol/L (ref 134–144)
Total Protein: 7.1 g/dL (ref 6.0–8.5)
eGFR: 80 mL/min/1.73

## 2024-07-12 LAB — LIPID PANEL
Chol/HDL Ratio: 2.9 ratio (ref 0.0–4.4)
Cholesterol, Total: 112 mg/dL (ref 100–199)
HDL: 38 mg/dL — ABNORMAL LOW
LDL Chol Calc (NIH): 57 mg/dL (ref 0–99)
Triglycerides: 88 mg/dL (ref 0–149)
VLDL Cholesterol Cal: 17 mg/dL (ref 5–40)

## 2024-07-12 LAB — CBC
Hematocrit: 43.4 % (ref 34.0–46.6)
Hemoglobin: 14.4 g/dL (ref 11.1–15.9)
MCH: 34.4 pg — ABNORMAL HIGH (ref 26.6–33.0)
MCHC: 33.2 g/dL (ref 31.5–35.7)
MCV: 104 fL — ABNORMAL HIGH (ref 79–97)
Platelets: 243 x10E3/uL (ref 150–450)
RBC: 4.19 x10E6/uL (ref 3.77–5.28)
RDW: 11.7 % (ref 11.7–15.4)
WBC: 10.1 x10E3/uL (ref 3.4–10.8)

## 2024-07-12 LAB — TSH: TSH: 1.18 u[IU]/mL (ref 0.450–4.500)

## 2024-07-12 LAB — VITAMIN D 25 HYDROXY (VIT D DEFICIENCY, FRACTURES): Vit D, 25-Hydroxy: 36.4 ng/mL (ref 30.0–100.0)

## 2024-07-12 LAB — HEMOGLOBIN A1C
Est. average glucose Bld gHb Est-mCnc: 123 mg/dL
Hgb A1c MFr Bld: 5.9 % — ABNORMAL HIGH (ref 4.8–5.6)
